# Patient Record
Sex: Male | Born: 1985 | Race: Black or African American | Hispanic: No | Marital: Married | Smoking: Current every day smoker
Health system: Southern US, Community
[De-identification: ages and names within clinical notes are randomized; demographics above are authoritative.]

## PROBLEM LIST (undated history)

## (undated) DIAGNOSIS — K219 Gastro-esophageal reflux disease without esophagitis: Secondary | ICD-10-CM

---
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
## 2006-05-23 ENCOUNTER — Emergency Department (HOSPITAL_COMMUNITY): Admission: EM | Admit: 2006-05-23 | Discharge: 2006-05-23 | Payer: Self-pay | Admitting: Family Medicine

## 2006-07-02 ENCOUNTER — Emergency Department (HOSPITAL_COMMUNITY): Admission: EM | Admit: 2006-07-02 | Discharge: 2006-07-02 | Payer: Self-pay | Admitting: Emergency Medicine

## 2006-09-23 ENCOUNTER — Emergency Department (HOSPITAL_COMMUNITY): Admission: EM | Admit: 2006-09-23 | Discharge: 2006-09-23 | Payer: Self-pay | Admitting: Emergency Medicine

## 2006-10-29 ENCOUNTER — Emergency Department (HOSPITAL_COMMUNITY): Admission: EM | Admit: 2006-10-29 | Discharge: 2006-10-29 | Payer: Self-pay | Admitting: Emergency Medicine

## 2008-10-04 ENCOUNTER — Emergency Department (HOSPITAL_COMMUNITY): Admission: EM | Admit: 2008-10-04 | Discharge: 2008-10-04 | Payer: Self-pay | Admitting: Family Medicine

## 2010-05-06 ENCOUNTER — Emergency Department (HOSPITAL_COMMUNITY)
Admission: EM | Admit: 2010-05-06 | Discharge: 2010-05-06 | Disposition: A | Discharge status: Home / Self Care | Payer: No Typology Code available for payment source | Attending: Emergency Medicine | Admitting: Emergency Medicine

## 2010-05-06 ENCOUNTER — Emergency Department (HOSPITAL_COMMUNITY): Payer: No Typology Code available for payment source

## 2010-05-06 DIAGNOSIS — S139XXA Sprain of joints and ligaments of unspecified parts of neck, initial encounter: Secondary | ICD-10-CM | POA: Insufficient documentation

## 2010-05-06 DIAGNOSIS — M542 Cervicalgia: Secondary | ICD-10-CM | POA: Insufficient documentation

## 2010-05-06 DIAGNOSIS — M2569 Stiffness of other specified joint, not elsewhere classified: Secondary | ICD-10-CM | POA: Insufficient documentation

## 2010-05-06 DIAGNOSIS — Y929 Unspecified place or not applicable: Secondary | ICD-10-CM | POA: Insufficient documentation

## 2012-04-01 ENCOUNTER — Emergency Department (HOSPITAL_COMMUNITY): Payer: Self-pay

## 2012-04-01 ENCOUNTER — Encounter (HOSPITAL_COMMUNITY): Payer: Self-pay | Admitting: Emergency Medicine

## 2012-04-01 ENCOUNTER — Emergency Department (HOSPITAL_COMMUNITY)
Admission: EM | Admit: 2012-04-01 | Discharge: 2012-04-01 | Disposition: A | Discharge status: Home / Self Care | Payer: Self-pay | Attending: Emergency Medicine | Admitting: Emergency Medicine

## 2012-04-01 DIAGNOSIS — F17200 Nicotine dependence, unspecified, uncomplicated: Secondary | ICD-10-CM | POA: Insufficient documentation

## 2012-04-01 DIAGNOSIS — Z8739 Personal history of other diseases of the musculoskeletal system and connective tissue: Secondary | ICD-10-CM | POA: Insufficient documentation

## 2012-04-01 DIAGNOSIS — M25579 Pain in unspecified ankle and joints of unspecified foot: Secondary | ICD-10-CM | POA: Insufficient documentation

## 2012-04-01 DIAGNOSIS — M25561 Pain in right knee: Secondary | ICD-10-CM

## 2012-04-01 MED ORDER — IBUPROFEN 800 MG PO TABS
800.0000 mg | ORAL_TABLET | Freq: Once | ORAL | Status: AC
  Administered 2012-04-01: 800 mg via ORAL
  Filled 2012-04-01: qty 1

## 2012-04-01 MED ORDER — HYDROCODONE-ACETAMINOPHEN 5-325 MG PO TABS
1.0000 | ORAL_TABLET | Freq: Once | ORAL | Status: AC
  Administered 2012-04-01: 1 via ORAL
  Filled 2012-04-01: qty 1

## 2012-04-01 MED ORDER — HYDROCODONE-ACETAMINOPHEN 5-325 MG PO TABS: 1.0000 | ORAL_TABLET | Freq: Three times a day (TID) | ORAL | Status: DC | PRN

## 2012-04-01 NOTE — ED Notes (Signed)
Pt complains of right knee pain

## 2012-04-01 NOTE — ED Provider Notes (Signed)
Medical screening examination/treatment/procedure(s) were performed by non-physician practitioner and as supervising physician I was immediately available for consultation/collaboration.   Rolan Bucco, MD 04/01/12 2240

## 2012-04-01 NOTE — ED Notes (Signed)
Pt states he would purchase a knee brace

## 2012-04-01 NOTE — ED Notes (Signed)
Patient transported to X-ray 

## 2012-04-01 NOTE — ED Provider Notes (Signed)
History    This chart was scribed for non-physician practitioner, Marlon Pel PA-C working with Rolan Bucco, MD by Smitty Pluck, ED scribe. This patient was seen in room WTR8/WTR8 and the patient's care was started at 5:34 PM.   CSN: [id number]  Arrival date & time 04/01/12  1620      Chief Complaint  Patient presents with  . Knee Pain    The history is provided by the patient. No language interpreter was used.   Gary Newton is a 27 y.o. male who presents to the Emergency Department complaining of constant, moderate right knee pain onset 2 days ago. Pt reports that he has a partial ACL tear that was diagnosed 1 year ago. He states that he stands on his feet at work for long periods of time. He has taken tylenol without relief. He states the pain is aggravated by standing and walking. Pt denies injury to knee, fall, fever, chills, nausea, vomiting, diarrhea, weakness, cough, SOB and any other pain.   Pt does not have PCP.    History reviewed. No pertinent past medical history.  History reviewed. No pertinent past surgical history.  No family history on file.  History  Substance Use Topics  . Smoking status: Current Every Day Smoker -- 0.50 packs/day    Types: Cigarettes  . Smokeless tobacco: Not on file  . Alcohol Use: Yes      Review of Systems  Constitutional: Negative for chills.  Respiratory: Negative for shortness of breath.   Gastrointestinal: Negative for nausea and vomiting.  Musculoskeletal: Positive for arthralgias.  Neurological: Negative for weakness.    Allergies  Review of patient's allergies indicates no known allergies.  Home Medications   Current Outpatient Rx  Name  Route  Sig  Dispense  Refill  . HYDROcodone-acetaminophen (NORCO/VICODIN) 5-325 MG per tablet   Oral   Take 1 tablet by mouth every 8 (eight) hours as needed for pain.   8 tablet   0     BP 108/70  Pulse 63  Temp(Src) 97.3 F (36.3 C) (Oral)  SpO2 100%  Physical  Exam  Nursing note and vitals reviewed. Constitutional: He is oriented to person, place, and time. He appears well-developed and well-nourished. No distress.  HENT:  Head: Normocephalic and atraumatic.  Eyes: EOM are normal.  Neck: Neck supple. No tracheal deviation present.  Cardiovascular: Normal rate.   Pulmonary/Chest: Effort normal. No respiratory distress.  Musculoskeletal: Normal range of motion.       Right knee: He exhibits normal range of motion, no swelling, no effusion, no ecchymosis, no deformity, no laceration, no erythema, normal alignment, no LCL laxity and normal patellar mobility. Tenderness found. Medial joint line tenderness noted. No lateral joint line, no MCL, no LCL and no patellar tendon tenderness noted.  Neurological: He is alert and oriented to person, place, and time.  Skin: Skin is warm and dry.  Psychiatric: He has a normal mood and affect. His behavior is normal.    ED Course  Procedures (including critical care time) DIAGNOSTIC STUDIES: Oxygen Saturation is 100% on room air, normal by my interpretation.    COORDINATION OF CARE: 5:39 PM Discussed ED treatment with pt and pt agrees to pain medication. Suggested pt wear knee sleeve and pt use ibuprofen. Referred to orthopedist Dr.Arthur Montez Morita- per patient request, his mom goes to Dr. Montez Morita  .     Labs Reviewed - No data to display Dg Knee Complete 4 Views Right  04/01/2012  *RADIOLOGY  REPORT*  Clinical Data: Right knee pain, swelling, increased medial pain, known right ACL tear  RIGHT KNEE - COMPLETE 4+ VIEW  Comparison: 10/29/2006  Findings: Osseous mineralization normal. Joint spaces preserved. No acute fracture, dislocation or bone destruction. Spurring at inferior patella. No knee joint effusion.  IMPRESSION: No acute osseous abnormalities.   Original Report Authenticated By: Ulyses Southward, M.D.      1. Joint pain, knee, left       MDM  Pt has been advised of the symptoms that warrant their return  to the ED. Patient has voiced understanding and has agreed to follow-up with the PCP or specialist.  I personally performed the services described in this documentation, which was scribed in my presence. The recorded information has been reviewed and is accurate.    Dorthula Matas, PA-C 04/01/12 1802

## 2014-05-19 ENCOUNTER — Encounter (HOSPITAL_COMMUNITY): Payer: Self-pay | Admitting: Emergency Medicine

## 2014-05-19 ENCOUNTER — Emergency Department (HOSPITAL_COMMUNITY)
Admission: EM | Admit: 2014-05-19 | Discharge: 2014-05-19 | Disposition: A | Discharge status: Home / Self Care | Payer: Self-pay | Attending: Emergency Medicine | Admitting: Emergency Medicine

## 2014-05-19 DIAGNOSIS — Z720 Tobacco use: Secondary | ICD-10-CM | POA: Insufficient documentation

## 2014-05-19 DIAGNOSIS — K088 Other specified disorders of teeth and supporting structures: Secondary | ICD-10-CM | POA: Insufficient documentation

## 2014-05-19 DIAGNOSIS — K029 Dental caries, unspecified: Secondary | ICD-10-CM | POA: Insufficient documentation

## 2014-05-19 MED ORDER — AMOXICILLIN 500 MG PO CAPS
1000.0000 mg | ORAL_CAPSULE | Freq: Once | ORAL | Status: AC
  Administered 2014-05-19: 1000 mg via ORAL
  Filled 2014-05-19: qty 2

## 2014-05-19 MED ORDER — AMOXICILLIN 500 MG PO CAPS: 1000.0000 mg | ORAL_CAPSULE | Freq: Two times a day (BID) | ORAL | Status: DC

## 2014-05-19 MED ORDER — OXYCODONE-ACETAMINOPHEN 5-325 MG PO TABS: 1.0000 | ORAL_TABLET | ORAL | Status: DC | PRN

## 2014-05-19 NOTE — ED Provider Notes (Signed)
CSN: [id number]     Arrival date & time 05/19/14  0414 History   First MD Initiated Contact with Patient 05/19/14 0515     Chief Complaint  Patient presents with  . Dental Pain     (Consider location/radiation/quality/duration/timing/severity/associated sxs/prior Treatment) Patient is a 29 y.o. male presenting with tooth pain. The history is provided by the patient.  Dental Pain He complains of toothache and a left lower molar for the last 4 months. Pain has been intermittent but has been constant over the last 3 days. He is unable to get any sleep because it appeared he rates pain at 10/10. Pain is better when he is active but worse if he lays still. He has taken ibuprofen without relief. He took an antibiotic from a friend who also had a toothache and this did not seem to give any relief.  History reviewed. No pertinent past medical history. History reviewed. No pertinent past surgical history. History reviewed. No pertinent family history. History  Substance Use Topics  . Smoking status: Current Every Day Smoker -- 0.50 packs/day    Types: Cigarettes  . Smokeless tobacco: Not on file  . Alcohol Use: Yes    Review of Systems  All other systems reviewed and are negative.     Allergies  Review of patient's allergies indicates no known allergies.  Home Medications   Prior to Admission medications   Medication Sig Start Date End Date Taking? Authorizing Provider  HYDROcodone-acetaminophen (NORCO/VICODIN) 5-325 MG per tablet Take 1 tablet by mouth every 8 (eight) hours as needed for pain. 04/01/12   Tiffany Neva SeatGreene, PA-C   BP 143/83 mmHg  Pulse 64  Temp(Src) 98.3 F (36.8 C) (Oral)  Resp 16  Ht 6' (1.829 m)  Wt 255 lb (115.667 kg)  BMI 34.58 kg/m2  SpO2 99% Physical Exam  Nursing note and vitals reviewed.  29 year old male, resting comfortably and in no acute distress. Vital signs are significant for borderline hypertension. Oxygen saturation is 99%, which is  normal. Head is normocephalic and atraumatic. PERRLA, EOMI. Oropharynx is clear. Dental caries are noted into the #17 with tenderness to percussion over the carious area. There is no gingival erythema, swelling, pallor. There is adequate space in his mouth for all of his third molars. Neck is nontender and supple without adenopathy or JVD. Back is nontender and there is no CVA tenderness. Lungs are clear without rales, wheezes, or rhonchi. Chest is nontender. Heart has regular rate and rhythm without murmur. Abdomen is soft, flat, nontender without masses or hepatosplenomegaly and peristalsis is normoactive. Extremities have no cyanosis or edema, full range of motion is present. Skin is warm and dry without rash. Neurologic: Mental status is normal, cranial nerves are intact, there are no motor or sensory deficits.  ED Course  Procedures (including critical care time)  MDM   Final diagnoses:  Pain due to dental caries    dental pain from caries in tooth #17. He is referred to the on-call dentist and is given prescriptions for amoxicillin and oxycodone-acetaminophen. Prior to prescription given, I did review his record on the [place] controlled substance reporting website and found no narcotic prescriptions in the last 6 months.    Dione Boozeavid Shonta Bourque, MD 05/19/14 [id number]

## 2014-05-19 NOTE — ED Notes (Signed)
Dental pain increased for the last day, with stiff jaw.  Patient states that he has had this for 4 months off and on.  Patient has not seen a dentist.

## 2014-05-19 NOTE — Discharge Instructions (Signed)
Dental Caries °Dental caries (also called tooth decay) is the most common oral disease. It can occur at any age but is more common in children and young adults.  °HOW DENTAL CARIES DEVELOPS  °The process of decay begins when bacteria and foods (particularly sugars and starches) combine in your mouth to produce plaque. Plaque is a substance that sticks to the hard, outer surface of a tooth (enamel). The bacteria in plaque produce acids that attack enamel. These acids may also attack the root surface of a tooth (cementum) if it is exposed. Repeated attacks dissolve these surfaces and create holes in the tooth (cavities). If left untreated, the acids destroy the other layers of the tooth.  °RISK FACTORS °· Frequent sipping of sugary beverages.   °· Frequent snacking on sugary and starchy foods, especially those that easily get stuck in the teeth.   °· Poor oral hygiene.   °· Dry mouth.   °· Substance abuse such as methamphetamine abuse.   °· Broken or poor-fitting dental restorations.   °· Eating disorders.   °· Gastroesophageal reflux disease (GERD).   °· Certain radiation treatments to the head and neck. °SYMPTOMS °In the early stages of dental caries, symptoms are seldom present. Sometimes white, chalky areas may be seen on the enamel or other tooth layers. In later stages, symptoms may include: °· Pits and holes on the enamel. °· Toothache after sweet, hot, or cold foods or drinks are consumed. °· Pain around the tooth. °· Swelling around the tooth. °DIAGNOSIS  °Most of the time, dental caries is detected during a regular dental checkup. A diagnosis is made after a thorough medical and dental history is taken and the surfaces of your teeth are checked for signs of dental caries. Sometimes special instruments, such as lasers, are used to check for dental caries. Dental X-ray exams may be taken so that areas not visible to the eye (such as between the contact areas of the teeth) can be checked for cavities.    °TREATMENT  °If dental caries is in its early stages, it may be reversed with a fluoride treatment or an application of a remineralizing agent at the dental office. Thorough brushing and flossing at home is needed to aid these treatments. If it is in its later stages, treatment depends on the location and extent of tooth destruction:  °· If a small area of the tooth has been destroyed, the destroyed area will be removed and cavities will be filled with a material such as gold, silver amalgam, or composite resin.   °· If a large area of the tooth has been destroyed, the destroyed area will be removed and a cap (crown) will be fitted over the remaining tooth structure.   °· If the center part of the tooth (pulp) is affected, a procedure called a root canal will be needed before a filling or crown can be placed.   °· If most of the tooth has been destroyed, the tooth may need to be pulled (extracted). °HOME CARE INSTRUCTIONS °You can prevent, stop, or reverse dental caries at home by practicing good oral hygiene. Good oral hygiene includes: °· Thoroughly cleaning your teeth at least twice a day with a toothbrush and dental floss.   °· Using a fluoride toothpaste. A fluoride mouth rinse may also be used if recommended by your dentist or health care provider.   °· Restricting the amount of sugary and starchy foods and sugary liquids you consume.   °· Avoiding frequent snacking on these foods and sipping of these liquids.   °· Keeping regular visits with   a dentist for checkups and cleanings. °PREVENTION  °· Practice good oral hygiene. °· Consider a dental sealant. A dental sealant is a coating material that is applied by your dentist to the pits and grooves of teeth. The sealant prevents food from being trapped in them. It may protect the teeth for several years. °· Ask about fluoride supplements if you live in a community without fluorinated water or with water that has a low fluoride content. Use fluoride supplements  as directed by your dentist or health care provider. °· Allow fluoride varnish applications to teeth if directed by your dentist or health care provider. °Document Released: 09/12/2001 Document Revised: 05/07/2013 Document Reviewed: 12/24/2011 °ExitCare® Patient Information ©2015 [employer]. This information is not intended to replace advice given to you by your health care provider. Make sure you discuss any questions you have with your health care provider. ° °Amoxicillin capsules or tablets °What is this medicine? °AMOXICILLIN (a mox i SIL in) is a penicillin antibiotic. It is used to treat certain kinds of bacterial infections. It will not work for colds, flu, or other viral infections. °This medicine may be used for other purposes; ask your health care provider or pharmacist if you have questions. °COMMON BRAND NAME(S): Amoxil, Moxilin, Sumox, Trimox °What should I tell my health care provider before I take this medicine? °They need to know if you have any of these conditions: °-asthma °-kidney disease °-an unusual or allergic reaction to amoxicillin, other penicillins, cephalosporin antibiotics, other medicines, foods, dyes, or preservatives °-pregnant or trying to get pregnant °-breast-feeding °How should I use this medicine? °Take this medicine by mouth with a glass of water. Follow the directions on your prescription label. You may take this medicine with food or on an empty stomach. Take your medicine at regular intervals. Do not take your medicine more often than directed. Take all of your medicine as directed even if you think your are better. Do not skip doses or stop your medicine early. °Talk to your pediatrician regarding the use of this medicine in children. While this drug may be prescribed for selected conditions, precautions do apply. °Overdosage: If you think you have taken too much of this medicine contact a poison control center or emergency room at once. °NOTE: This medicine is only for  you. Do not share this medicine with others. °What if I miss a dose? °If you miss a dose, take it as soon as you can. If it is almost time for your next dose, take only that dose. Do not take double or extra doses. °What may interact with this medicine? °-amiloride °-birth control pills °-chloramphenicol °-macrolides °-probenecid °-sulfonamides °-tetracyclines °This list may not describe all possible interactions. Give your health care provider a list of all the medicines, herbs, non-prescription drugs, or dietary supplements you use. Also tell them if you smoke, drink alcohol, or use illegal drugs. Some items may interact with your medicine. °What should I watch for while using this medicine? °Tell your doctor or health care professional if your symptoms do not improve in 2 or 3 days. Take all of the doses of your medicine as directed. Do not skip doses or stop your medicine early. °If you are diabetic, you may get a false positive result for sugar in your urine with certain brands of urine tests. Check with your doctor. °Do not treat diarrhea with over-the-counter products. Contact your doctor if you have diarrhea that lasts more than 2 days or if the diarrhea is severe and   watery. °What side effects may I notice from receiving this medicine? °Side effects that you should report to your doctor or health care professional as soon as possible: °-allergic reactions like skin rash, itching or hives, swelling of the face, lips, or tongue °-breathing problems °-dark urine °-redness, blistering, peeling or loosening of the skin, including inside the mouth °-seizures °-severe or watery diarrhea °-trouble passing urine or change in the amount of urine °-unusual bleeding or bruising °-unusually weak or tired °-yellowing of the eyes or skin °Side effects that usually do not require medical attention (report to your doctor or health care professional if they continue or are bothersome): °-dizziness °-headache °-stomach  upset °-trouble sleeping °This list may not describe all possible side effects. Call your doctor for medical advice about side effects. You may report side effects to FDA at 1-800-FDA-1088. °Where should I keep my medicine? °Keep out of the reach of children. °Store between 68 and 77 degrees F (20 and 25 degrees C). Keep bottle closed tightly. Throw away any unused medicine after the expiration date. °NOTE: This sheet is a summary. It may not cover all possible information. If you have questions about this medicine, talk to your doctor, pharmacist, or health care provider. °© 2015, Elsevier/Gold Standard. (2007-03-14 14:10:59) ° °Acetaminophen; Oxycodone tablets °What is this medicine? °ACETAMINOPHEN; OXYCODONE (a set a MEE noe fen; ox i KOE done) is a pain reliever. It is used to treat mild to moderate pain. °This medicine may be used for other purposes; ask your health care provider or pharmacist if you have questions. °COMMON BRAND NAME(S): Endocet, Magnacet, Narvox, Percocet, Perloxx, Primalev, Primlev, Roxicet, Xolox °What should I tell my health care provider before I take this medicine? °They need to know if you have any of these conditions: °-brain tumor °-Crohn's disease, inflammatory bowel disease, or ulcerative colitis °-drug abuse or addiction °-head injury °-heart or circulation problems °-if you often drink alcohol °-kidney disease or problems going to the bathroom °-liver disease °-lung disease, asthma, or breathing problems °-an unusual or allergic reaction to acetaminophen, oxycodone, other opioid analgesics, other medicines, foods, dyes, or preservatives °-pregnant or trying to get pregnant °-breast-feeding °How should I use this medicine? °Take this medicine by mouth with a full glass of water. Follow the directions on the prescription label. Take your medicine at regular intervals. Do not take your medicine more often than directed. °Talk to your pediatrician regarding the use of this medicine in  children. Special care may be needed. °Patients over 65 years old may have a stronger reaction and need a smaller dose. °Overdosage: If you think you have taken too much of this medicine contact a poison control center or emergency room at once. °NOTE: This medicine is only for you. Do not share this medicine with others. °What if I miss a dose? °If you miss a dose, take it as soon as you can. If it is almost time for your next dose, take only that dose. Do not take double or extra doses. °What may interact with this medicine? °-alcohol °-antihistamines °-barbiturates like amobarbital, butalbital, butabarbital, methohexital, pentobarbital, phenobarbital, thiopental, and secobarbital °-benztropine °-drugs for bladder problems like solifenacin, trospium, oxybutynin, tolterodine, hyoscyamine, and methscopolamine °-drugs for breathing problems like ipratropium and tiotropium °-drugs for certain stomach or intestine problems like propantheline, homatropine methylbromide, glycopyrrolate, atropine, belladonna, and dicyclomine °-general anesthetics like etomidate, ketamine, nitrous oxide, propofol, desflurane, enflurane, halothane, isoflurane, and sevoflurane °-medicines for depression, anxiety, or psychotic disturbances °-medicines for sleep °-muscle relaxants °-naltrexone °-  narcotic medicines (opiates) for pain °-phenothiazines like perphenazine, thioridazine, chlorpromazine, mesoridazine, fluphenazine, prochlorperazine, promazine, and trifluoperazine °-scopolamine °-tramadol °-trihexyphenidyl °This list may not describe all possible interactions. Give your health care provider a list of all the medicines, herbs, non-prescription drugs, or dietary supplements you use. Also tell them if you smoke, drink alcohol, or use illegal drugs. Some items may interact with your medicine. °What should I watch for while using this medicine? °Tell your doctor or health care professional if your pain does not go away, if it gets worse,  or if you have new or a different type of pain. You may develop tolerance to the medicine. Tolerance means that you will need a higher dose of the medication for pain relief. Tolerance is normal and is expected if you take this medicine for a long time. °Do not suddenly stop taking your medicine because you may develop a severe reaction. Your body becomes used to the medicine. This does NOT mean you are addicted. Addiction is a behavior related to getting and using a drug for a non-medical reason. If you have pain, you have a medical reason to take pain medicine. Your doctor will tell you how much medicine to take. If your doctor wants you to stop the medicine, the dose will be slowly lowered over time to avoid any side effects. °You may get drowsy or dizzy. Do not drive, use machinery, or do anything that needs mental alertness until you know how this medicine affects you. Do not stand or sit up quickly, especially if you are an older patient. This reduces the risk of dizzy or fainting spells. Alcohol may interfere with the effect of this medicine. Avoid alcoholic drinks. °There are different types of narcotic medicines (opiates) for pain. If you take more than one type at the same time, you may have more side effects. Give your health care provider a list of all medicines you use. Your doctor will tell you how much medicine to take. Do not take more medicine than directed. Call emergency for help if you have problems breathing. °The medicine will cause constipation. Try to have a bowel movement at least every 2 to 3 days. If you do not have a bowel movement for 3 days, call your doctor or health care professional. °Do not take Tylenol (acetaminophen) or medicines that have acetaminophen with this medicine. Too much acetaminophen can be very dangerous. Many nonprescription medicines contain acetaminophen. Always read the labels carefully to avoid taking more acetaminophen. °What side effects may I notice from  receiving this medicine? °Side effects that you should report to your doctor or health care professional as soon as possible: °-allergic reactions like skin rash, itching or hives, swelling of the face, lips, or tongue °-breathing difficulties, wheezing °-confusion °-light headedness or fainting spells °-severe stomach pain °-unusually weak or tired °-yellowing of the skin or the whites of the eyes °Side effects that usually do not require medical attention (report to your doctor or health care professional if they continue or are bothersome): °-dizziness °-drowsiness °-nausea °-vomiting °This list may not describe all possible side effects. Call your doctor for medical advice about side effects. You may report side effects to FDA at 1-800-FDA-1088. °Where should I keep my medicine? °Keep out of the reach of children. This medicine can be abused. Keep your medicine in a safe place to protect it from theft. Do not share this medicine with anyone. Selling or giving away this medicine is dangerous and against the law. °Store at   room temperature between 20 and 25 degrees C (68 and 77 degrees F). Keep container tightly closed. Protect from light. °This medicine may cause accidental overdose and death if it is taken by other adults, children, or pets. Flush any unused medicine down the toilet to reduce the chance of harm. Do not use the medicine after the expiration date. °NOTE: This sheet is a summary. It may not cover all possible information. If you have questions about this medicine, talk to your doctor, pharmacist, or health care provider. °© 2015, Elsevier/Gold Standard. (2012-08-14 13:17:35) ° ° °Emergency Department Resource Guide ° °Dental Care: °Organization         Address  Phone  Notes  °[facility] [address], [place] [phone] Accepts children up to age 21 who are enrolled in Medicaid or [place] Health Choice; pregnant women with a  Medicaid card; and children who have applied for Medicaid or [place] Health Choice, but were declined, whose parents can pay a reduced fee at time of service.  °[facility] [place]  [address], [place] [phone] Accepts children up to age 21 who are enrolled in Medicaid or [place] Health Choice; pregnant women with a Medicaid card; and children who have applied for Medicaid or [place] Health Choice, but were declined, whose parents can pay a reduced fee at time of service.  °[facility] ° [address], [place] [phone] Patients are seen by appointment only. Walk-ins are not accepted. [facility] will see patients 18 years of age and older. °Monday - Tuesday (8am-5pm) °Most Wednesdays (8:30-5pm) °$30 per visit, cash only  °[facility] ° [address], [place] [phone] Patients are seen by appointment only. Walk-ins are not accepted. [facility] will see patients 18 years of age and older. °One Wednesday Evening (Monthly: Volunteer Based).  $30 per visit, cash only  °[facility]  [phone] for adults; Children under age 4, call Graduate Pediatric Dentistry at [phone]. Children aged 4-14, please call [phone] to request a pediatric application. ° Dental services are provided in all areas of dental care including fillings, crowns and bridges, complete and partial dentures, implants, gum treatment, root canals, and extractions. Preventive care is also provided. Treatment is provided to both adults and children. °Patients are selected via a lottery and there is often a waiting list. °  °[facility] [address], °[place] ° [phone] [email or web address] °  °[facility] [address], [place], [place] [phone], Ext. [phone] Second and Fourth Thursday of each month, opens at 6:30 AM; Clinic ends at 9 AM.  Patients are seen on a  first-come first-served basis, and a limited number are seen during each clinic.  ° °[facility] ° [address], [place], [place] [phone]   Eligibility Requirements °You must have lived in [place], [place], or [place] counties for at least the last three months. °  You cannot be eligible for state or federal sponsored healthcare insurance, including Veterans Administration, Medicaid, or Medicare. °  You generally cannot be eligible for healthcare insurance through your employer.  °  How to apply: °Eligibility screenings are held every Tuesday and Wednesday afternoon from 1:00 pm until 4:00 pm. You do not need an appointment for the interview!  °[facility] [address], [place], [place]   [phone]   °[facility]  [phone]   °[facility]  [phone]   °[facility]  [phone]   ° °

## 2014-05-19 NOTE — ED Notes (Signed)
MD at bedside. 

## 2014-07-04 ENCOUNTER — Encounter (HOSPITAL_COMMUNITY): Payer: Self-pay | Admitting: *Deleted

## 2014-07-04 ENCOUNTER — Emergency Department (INDEPENDENT_AMBULATORY_CARE_PROVIDER_SITE_OTHER)
Admission: EM | Admit: 2014-07-04 | Discharge: 2014-07-04 | Disposition: A | Discharge status: Home / Self Care | Payer: Self-pay | Source: Home / Self Care | Attending: Family Medicine | Admitting: Family Medicine

## 2014-07-04 DIAGNOSIS — L03012 Cellulitis of left finger: Secondary | ICD-10-CM

## 2014-07-04 DIAGNOSIS — Z23 Encounter for immunization: Secondary | ICD-10-CM

## 2014-07-04 DIAGNOSIS — S61219A Laceration without foreign body of unspecified finger without damage to nail, initial encounter: Secondary | ICD-10-CM

## 2014-07-04 MED ORDER — HYDROCODONE-ACETAMINOPHEN 5-325 MG PO TABS: 1.0000 | ORAL_TABLET | ORAL | Status: DC | PRN

## 2014-07-04 MED ORDER — TETANUS-DIPHTH-ACELL PERTUSSIS 5-2.5-18.5 LF-MCG/0.5 IM SUSP
0.5000 mL | Freq: Once | INTRAMUSCULAR | Status: AC
  Administered 2014-07-04: 0.5 mL via INTRAMUSCULAR

## 2014-07-04 MED ORDER — CEPHALEXIN 500 MG PO CAPS
500.0000 mg | ORAL_CAPSULE | Freq: Four times a day (QID) | ORAL | Status: DC
Start: 2014-07-04 — End: 2015-11-14

## 2014-07-04 MED ORDER — TETANUS-DIPHTH-ACELL PERTUSSIS 5-2.5-18.5 LF-MCG/0.5 IM SUSP
INTRAMUSCULAR | Status: AC
  Filled 2014-07-04: qty 0.5

## 2014-07-04 NOTE — ED Notes (Signed)
Pt  Reports some  Pain  l finger       X  2     Days        Pt  Reports    The     Pt  Sustained a  Superficial lacertion to  The  Affected  Finger

## 2014-07-04 NOTE — Discharge Instructions (Signed)
Cellulitis Cellulitis is an infection of the skin and the tissue under the skin. The infected area is usually red and tender. This happens most often in the arms and lower legs. HOME CARE   Take your antibiotic medicine as told. Finish the medicine even if you start to feel better.  Keep the infected arm or leg raised (elevated).  Put a warm cloth on the area up to 4 times per day.  Only take medicines as told by your doctor.  Keep all doctor visits as told. GET HELP IF:  You see red streaks on the skin coming from the infected area.  Your red area gets bigger or turns a dark color.  Your bone or joint under the infected area is painful after the skin heals.  Your infection comes back in the same area or different area.  You have a puffy (swollen) bump in the infected area.  You have new symptoms.  You have a fever. GET HELP RIGHT AWAY IF:   You feel very sleepy.  You throw up (vomit) or have watery poop (diarrhea).  You feel sick and have muscle aches and pains. MAKE SURE YOU:   Understand these instructions.  Will watch your condition.  Will get help right away if you are not doing well or get worse. Document Released: 06/09/2007 Document Revised: 05/07/2013 Document Reviewed: 03/08/2011 [facility] Patient Information 2015 [place], [place]. This information is not intended to replace advice given to you by your health care provider. Make sure you discuss any questions you have with your health care provider.  Laceration Care, Adult A laceration is a cut that goes through all layers of the skin. The cut goes into the tissue beneath the skin. HOME CARE For stitches (sutures) or staples:  Keep the cut clean and dry.  If you have a bandage (dressing), change it at least once a day. Change the bandage if it gets wet or dirty, or as told by your doctor.  Wash the cut with soap and water 2 times a day. Rinse the cut with water. Pat it dry with a clean towel.  Put a  thin layer of medicated cream on the cut as told by your doctor.  You may shower after the first 24 hours. Do not soak the cut in water until the stitches are removed.  Only take medicines as told by your doctor.  Have your stitches or staples removed as told by your doctor. For skin adhesive strips:  Keep the cut clean and dry.  Do not get the strips wet. You may take a bath, but be careful to keep the cut dry.  If the cut gets wet, pat it dry with a clean towel.  The strips will fall off on their own. Do not remove the strips that are still stuck to the cut. For wound glue:  You may shower or take baths. Do not soak or scrub the cut. Do not swim. Avoid heavy sweating until the glue falls off on its own. After a shower or bath, pat the cut dry with a clean towel.  Do not put medicine on your cut until the glue falls off.  If you have a bandage, do not put tape over the glue.  Avoid lots of sunlight or tanning lamps until the glue falls off. Put sunscreen on the cut for the first year to reduce your scar.  The glue will fall off on its own. Do not pick at the glue. You may need  a tetanus shot if:  You cannot remember when you had your last tetanus shot.  You have never had a tetanus shot. If you need a tetanus shot and you choose not to have one, you may get tetanus. Sickness from tetanus can be serious. GET HELP RIGHT AWAY IF:   Your pain does not get better with medicine.  Your arm, hand, leg, or foot loses feeling (numbness) or changes color.  Your cut is bleeding.  Your joint feels weak, or you cannot use your joint.  You have painful lumps on your body.  Your cut is red, puffy (swollen), or painful.  You have a red line on the skin near the cut.  You have yellowish-white fluid (pus) coming from the cut.  You have a fever.  You have a bad smell coming from the cut or bandage.  Your cut breaks open before or after stitches are removed.  You notice something  coming out of the cut, such as wood or glass.  You cannot move a finger or toe. MAKE SURE YOU:   Understand these instructions.  Will watch your condition.  Will get help right away if you are not doing well or get worse. Document Released: 06/09/2007 Document Revised: 03/15/2011 Document Reviewed: 06/16/2010 [facility] Patient Information 2015 [place], [place]. This information is not intended to replace advice given to you by your health care provider. Make sure you discuss any questions you have with your health care provider.

## 2014-07-04 NOTE — ED Provider Notes (Signed)
CSN: [id number]     Arrival date & time 07/04/14  1331 History   First MD Initiated Contact with Patient 07/04/14 1429     Chief Complaint  Patient presents with  . Hand Pain   (Consider location/radiation/quality/duration/timing/severity/associated sxs/prior Treatment) HPI Comments: 29 year old male complaining of pain and throbbing to the left middle finger distal phalanx for 2 days. He states that one of his jobs he accidentally received a cut with a sharp object at the flexor crease of the left DIP. At this point the superficial laceration has healed over. His flexion ability is decreased but still present. There is no current exudates or bleeding at the laceration. It is dry and healing by secondary intention. It is actually difficult to see.   History reviewed. No pertinent past medical history. History reviewed. No pertinent past surgical history. History reviewed. No pertinent family history. History  Substance Use Topics  . Smoking status: Current Every Day Smoker -- 0.50 packs/day    Types: Cigarettes  . Smokeless tobacco: Not on file  . Alcohol Use: Yes    Review of Systems  Constitutional: Negative.   Respiratory: Negative.   Gastrointestinal: Negative.   Genitourinary: Negative.   Musculoskeletal: Negative for myalgias and back pain.  Skin: Negative.   Neurological: Negative.     Allergies  Review of patient's allergies indicates no known allergies.  Home Medications   Prior to Admission medications   Medication Sig Start Date End Date Taking? Authorizing Provider  amoxicillin (AMOXIL) 500 MG capsule Take 2 capsules (1,000 mg total) by mouth 2 (two) times daily. 05/19/14   Dione Booze, MD  cephALEXin (KEFLEX) 500 MG capsule Take 1 capsule (500 mg total) by mouth 4 (four) times daily. 07/04/14   Hayden Rasmussen, NP  HYDROcodone-acetaminophen (NORCO/VICODIN) 5-325 MG per tablet Take 1 tablet by mouth every 4 (four) hours as needed. 07/04/14   Hayden Rasmussen, NP   oxyCODONE-acetaminophen (PERCOCET) 5-325 MG per tablet Take 1 tablet by mouth every 4 (four) hours as needed for moderate pain. 05/19/14   Dione Booze, MD   BP 118/63 mmHg  Pulse 69  Temp(Src) 98.2 F (36.8 C) (Oral)  Resp 16  SpO2 99% Physical Exam  Constitutional: He appears well-developed and well-nourished. No distress.  Cardiovascular: Normal rate.   Pulmonary/Chest: Effort normal. No respiratory distress.  Musculoskeletal:  Mild swelling and redness to the left middle finger distal phalanx. No evidence of paronychia or felon. Mildly tender at the DIP joint. No changes to the middle phalanx. No erythema or swelling. Extension  is completely intact. Flexion against resistance to approximately 20 is intact. No exudates.  Neurological: He is alert. He exhibits normal muscle tone.  Skin: Skin is warm.  Psychiatric: He has a normal mood and affect.  Nursing note and vitals reviewed.   ED Course  Procedures (including critical care time) Labs Review Labs Reviewed - No data to display  Imaging Review No results found.   MDM   1. Laceration of finger of left hand, initial encounter   2. Cellulitis of finger of left hand    At this time doubt tenosynovitis. We will treat distal phalanx infection with Keflex 500 mg Administer T gap 0.5 cc IM and Rx for norco 5 mg #12. Soak finger in warm water a few times a day Discussed in detail red flags to promptly seek medical attention. For any increasing in swelling, redness, pain to distal phalanx or involvement of the middle phalanx he should seek medical attention immediately.  Hayden Rasmussenavid Fowler Antos, NP 07/04/14 1501

## 2014-07-06 ENCOUNTER — Ambulatory Visit (HOSPITAL_COMMUNITY)
Admission: EM | Admit: 2014-07-06 | Discharge: 2014-07-06 | Disposition: A | Discharge status: Home / Self Care | Payer: Self-pay | Attending: Emergency Medicine | Admitting: Emergency Medicine

## 2014-07-06 ENCOUNTER — Encounter (HOSPITAL_COMMUNITY): Payer: Self-pay | Admitting: *Deleted

## 2014-07-06 ENCOUNTER — Encounter (HOSPITAL_COMMUNITY)
Admission: EM | Disposition: A | Discharge status: Home / Self Care | Payer: Self-pay | Source: Home / Self Care | Attending: Emergency Medicine

## 2014-07-06 ENCOUNTER — Emergency Department (HOSPITAL_COMMUNITY): Payer: Self-pay | Admitting: Certified Registered Nurse Anesthetist

## 2014-07-06 DIAGNOSIS — L089 Local infection of the skin and subcutaneous tissue, unspecified: Secondary | ICD-10-CM

## 2014-07-06 DIAGNOSIS — M659 Synovitis and tenosynovitis, unspecified: Secondary | ICD-10-CM

## 2014-07-06 DIAGNOSIS — L02519 Cutaneous abscess of unspecified hand: Secondary | ICD-10-CM | POA: Insufficient documentation

## 2014-07-06 DIAGNOSIS — F17210 Nicotine dependence, cigarettes, uncomplicated: Secondary | ICD-10-CM | POA: Insufficient documentation

## 2014-07-06 HISTORY — PX: I&D EXTREMITY: SHX5045

## 2014-07-06 LAB — CBC WITH DIFFERENTIAL/PLATELET
Basophils Absolute: 0 10*3/uL (ref 0.0–0.1)
Basophils Relative: 0 % (ref 0–1)
EOS ABS: 0.1 10*3/uL (ref 0.0–0.7)
Eosinophils Relative: 1 % (ref 0–5)
HEMATOCRIT: 45.5 % (ref 39.0–52.0)
HEMOGLOBIN: 15 g/dL (ref 13.0–17.0)
Lymphocytes Relative: 19 % (ref 12–46)
Lymphs Abs: 1.5 10*3/uL (ref 0.7–4.0)
MCH: 27.5 pg (ref 26.0–34.0)
MCHC: 33 g/dL (ref 30.0–36.0)
MCV: 83.5 fL (ref 78.0–100.0)
Monocytes Absolute: 0.9 10*3/uL (ref 0.1–1.0)
Monocytes Relative: 12 % (ref 3–12)
NEUTROS ABS: 5.2 10*3/uL (ref 1.7–7.7)
Neutrophils Relative %: 68 % (ref 43–77)
Platelets: 247 10*3/uL (ref 150–400)
RBC: 5.45 MIL/uL (ref 4.22–5.81)
RDW: 13.9 % (ref 11.5–15.5)
WBC: 7.6 10*3/uL (ref 4.0–10.5)

## 2014-07-06 LAB — BASIC METABOLIC PANEL
Anion gap: 7 (ref 5–15)
BUN: 11 mg/dL (ref 6–20)
CO2: 29 mmol/L (ref 22–32)
CREATININE: 1.29 mg/dL — AB (ref 0.61–1.24)
Calcium: 9.3 mg/dL (ref 8.9–10.3)
Chloride: 104 mmol/L (ref 101–111)
GFR calc non Af Amer: >60 mL/min (ref >60)
GLUCOSE: 67 mg/dL (ref 65–99)
Potassium: 4.2 mmol/L (ref 3.5–5.1)
Sodium: 140 mmol/L (ref 135–145)

## 2014-07-06 SURGERY — IRRIGATION AND DEBRIDEMENT EXTREMITY
Anesthesia: General | Laterality: Left

## 2014-07-06 MED ORDER — PROPOFOL 10 MG/ML IV BOLUS
INTRAVENOUS | Status: DC | PRN
  Administered 2014-07-06: 200 mg via INTRAVENOUS

## 2014-07-06 MED ORDER — SUCCINYLCHOLINE CHLORIDE 20 MG/ML IJ SOLN
INTRAMUSCULAR | Status: DC | PRN
  Administered 2014-07-06: 100 mg via INTRAVENOUS

## 2014-07-06 MED ORDER — OXYCODONE-ACETAMINOPHEN 5-325 MG PO TABS: 1.0000 | ORAL_TABLET | ORAL | Status: DC | PRN

## 2014-07-06 MED ORDER — KETOROLAC TROMETHAMINE 30 MG/ML IJ SOLN
30.0000 mg | Freq: Once | INTRAMUSCULAR | Status: AC | PRN
  Administered 2014-07-06: 30 mg via INTRAVENOUS

## 2014-07-06 MED ORDER — OXYCODONE HCL 5 MG PO TABS
ORAL_TABLET | ORAL | Status: DC
Start: 2014-07-06 — End: 2014-07-06
  Filled 2014-07-06: qty 1

## 2014-07-06 MED ORDER — MIDAZOLAM HCL 2 MG/2ML IJ SOLN
INTRAMUSCULAR | Status: AC
  Filled 2014-07-06: qty 2

## 2014-07-06 MED ORDER — FENTANYL CITRATE (PF) 250 MCG/5ML IJ SOLN
INTRAMUSCULAR | Status: AC
  Filled 2014-07-06: qty 5

## 2014-07-06 MED ORDER — HYDROMORPHONE HCL 1 MG/ML IJ SOLN: 0.2500 mg | INTRAMUSCULAR | Status: DC | PRN

## 2014-07-06 MED ORDER — PROMETHAZINE HCL 25 MG/ML IJ SOLN: 6.2500 mg | INTRAMUSCULAR | Status: DC | PRN

## 2014-07-06 MED ORDER — ONDANSETRON HCL 4 MG/2ML IJ SOLN
INTRAMUSCULAR | Status: DC | PRN
  Administered 2014-07-06: 4 mg via INTRAVENOUS

## 2014-07-06 MED ORDER — PROPOFOL 10 MG/ML IV BOLUS
INTRAVENOUS | Status: AC
  Filled 2014-07-06: qty 20

## 2014-07-06 MED ORDER — CEFAZOLIN SODIUM-DEXTROSE 2-3 GM-% IV SOLR
INTRAVENOUS | Status: AC
  Filled 2014-07-06: qty 50

## 2014-07-06 MED ORDER — LACTATED RINGERS IV SOLN
INTRAVENOUS | Status: DC | PRN
Start: 2014-07-06 — End: 2014-07-06
  Administered 2014-07-06: 15:00:00 via INTRAVENOUS

## 2014-07-06 MED ORDER — HYDROMORPHONE HCL 1 MG/ML IJ SOLN
INTRAMUSCULAR | Status: AC
  Filled 2014-07-06: qty 1

## 2014-07-06 MED ORDER — OXYCODONE HCL 5 MG PO TABS
5.0000 mg | ORAL_TABLET | Freq: Once | ORAL | Status: AC | PRN
  Administered 2014-07-06: 5 mg via ORAL

## 2014-07-06 MED ORDER — OXYCODONE HCL 5 MG/5ML PO SOLN: 5.0000 mg | Freq: Once | ORAL | Status: AC | PRN

## 2014-07-06 MED ORDER — MEPERIDINE HCL 25 MG/ML IJ SOLN: 6.2500 mg | INTRAMUSCULAR | Status: DC | PRN

## 2014-07-06 MED ORDER — FENTANYL CITRATE (PF) 100 MCG/2ML IJ SOLN
INTRAMUSCULAR | Status: DC | PRN
  Administered 2014-07-06: 50 ug via INTRAVENOUS
  Administered 2014-07-06 (×2): 100 ug via INTRAVENOUS

## 2014-07-06 MED ORDER — CEFAZOLIN SODIUM-DEXTROSE 2-3 GM-% IV SOLR
INTRAVENOUS | Status: DC | PRN
  Administered 2014-07-06: 2 g via INTRAVENOUS

## 2014-07-06 MED ORDER — MIDAZOLAM HCL 5 MG/5ML IJ SOLN
INTRAMUSCULAR | Status: DC | PRN
  Administered 2014-07-06: 2 mg via INTRAVENOUS

## 2014-07-06 MED ORDER — DOXYCYCLINE HYCLATE 100 MG PO TABS: 100.0000 mg | ORAL_TABLET | Freq: Two times a day (BID) | ORAL | Status: DC

## 2014-07-06 MED ORDER — MORPHINE SULFATE 4 MG/ML IJ SOLN
4.0000 mg | Freq: Once | INTRAMUSCULAR | Status: AC
  Administered 2014-07-06: 4 mg via INTRAMUSCULAR
  Filled 2014-07-06: qty 1

## 2014-07-06 MED ORDER — KETOROLAC TROMETHAMINE 30 MG/ML IJ SOLN
INTRAMUSCULAR | Status: AC
  Filled 2014-07-06: qty 1

## 2014-07-06 SURGICAL SUPPLY — 56 items
BANDAGE ELASTIC 3 VELCRO ST LF (GAUZE/BANDAGES/DRESSINGS) ×3 IMPLANT
BANDAGE ELASTIC 4 VELCRO ST LF (GAUZE/BANDAGES/DRESSINGS) ×3 IMPLANT
BANDAGE GAUZE 4  KLING STR (GAUZE/BANDAGES/DRESSINGS) ×3 IMPLANT
BNDG COHESIVE 1X5 TAN STRL LF (GAUZE/BANDAGES/DRESSINGS) ×3 IMPLANT
BNDG CONFORM 2 STRL LF (GAUZE/BANDAGES/DRESSINGS) IMPLANT
BNDG ESMARK 4X9 LF (GAUZE/BANDAGES/DRESSINGS) ×3 IMPLANT
BNDG GAUZE ELAST 4 BULKY (GAUZE/BANDAGES/DRESSINGS) ×3 IMPLANT
CORDS BIPOLAR (ELECTRODE) ×3 IMPLANT
COVER SURGICAL LIGHT HANDLE (MISCELLANEOUS) ×3 IMPLANT
CUFF TOURNIQUET SINGLE 18IN (TOURNIQUET CUFF) ×3 IMPLANT
CUFF TOURNIQUET SINGLE 24IN (TOURNIQUET CUFF) IMPLANT
DRAIN PENROSE 1/4X12 LTX STRL (WOUND CARE) IMPLANT
DRAPE SURG 17X23 STRL (DRAPES) ×3 IMPLANT
DRSG ADAPTIC 3X8 NADH LF (GAUZE/BANDAGES/DRESSINGS) ×3 IMPLANT
ELECT REM PT RETURN 9FT ADLT (ELECTROSURGICAL)
ELECTRODE REM PT RTRN 9FT ADLT (ELECTROSURGICAL) IMPLANT
GAUZE SPONGE 2X2 8PLY STRL LF (GAUZE/BANDAGES/DRESSINGS) ×1 IMPLANT
GAUZE SPONGE 4X4 12PLY STRL (GAUZE/BANDAGES/DRESSINGS) ×3 IMPLANT
GAUZE XEROFORM 1X8 LF (GAUZE/BANDAGES/DRESSINGS) ×3 IMPLANT
GAUZE XEROFORM 5X9 LF (GAUZE/BANDAGES/DRESSINGS) IMPLANT
GLOVE BIOGEL PI IND STRL 8.5 (GLOVE) ×1 IMPLANT
GLOVE BIOGEL PI INDICATOR 8.5 (GLOVE) ×2
GLOVE SURG ORTHO 8.0 STRL STRW (GLOVE) ×3 IMPLANT
GOWN STRL REUS W/ TWL LRG LVL3 (GOWN DISPOSABLE) ×3 IMPLANT
GOWN STRL REUS W/ TWL XL LVL3 (GOWN DISPOSABLE) ×1 IMPLANT
GOWN STRL REUS W/TWL LRG LVL3 (GOWN DISPOSABLE) ×6
GOWN STRL REUS W/TWL XL LVL3 (GOWN DISPOSABLE) ×2
HANDPIECE INTERPULSE COAX TIP (DISPOSABLE)
KIT BASIN OR (CUSTOM PROCEDURE TRAY) ×3 IMPLANT
KIT ROOM TURNOVER OR (KITS) ×3 IMPLANT
MANIFOLD NEPTUNE II (INSTRUMENTS) ×3 IMPLANT
NEEDLE HYPO 25GX1X1/2 BEV (NEEDLE) IMPLANT
NS IRRIG 1000ML POUR BTL (IV SOLUTION) ×3 IMPLANT
PACK ORTHO EXTREMITY (CUSTOM PROCEDURE TRAY) ×3 IMPLANT
PAD ARMBOARD 7.5X6 YLW CONV (MISCELLANEOUS) ×6 IMPLANT
PAD CAST 4YDX4 CTTN HI CHSV (CAST SUPPLIES) ×1 IMPLANT
PADDING CAST COTTON 4X4 STRL (CAST SUPPLIES) ×2
SET HNDPC FAN SPRY TIP SCT (DISPOSABLE) IMPLANT
SOAP 2 % CHG 4 OZ (WOUND CARE) ×3 IMPLANT
SPONGE GAUZE 2X2 STER 10/PKG (GAUZE/BANDAGES/DRESSINGS) ×2
SPONGE LAP 18X18 X RAY DECT (DISPOSABLE) ×3 IMPLANT
SPONGE LAP 4X18 X RAY DECT (DISPOSABLE) ×3 IMPLANT
SUCTION FRAZIER TIP 10 FR DISP (SUCTIONS) ×3 IMPLANT
SUT ETHILON 4 0 PS 2 18 (SUTURE) IMPLANT
SUT ETHILON 5 0 P 3 18 (SUTURE)
SUT NYLON ETHILON 5-0 P-3 1X18 (SUTURE) IMPLANT
SUT PROLENE 4 0 PS 2 18 (SUTURE) ×3 IMPLANT
SYR CONTROL 10ML LL (SYRINGE) IMPLANT
TOWEL OR 17X24 6PK STRL BLUE (TOWEL DISPOSABLE) ×3 IMPLANT
TOWEL OR 17X26 10 PK STRL BLUE (TOWEL DISPOSABLE) ×3 IMPLANT
TUBE ANAEROBIC SPECIMEN COL (MISCELLANEOUS) IMPLANT
TUBE CONNECTING 12'X1/4 (SUCTIONS) ×1
TUBE CONNECTING 12X1/4 (SUCTIONS) ×2 IMPLANT
UNDERPAD 30X30 INCONTINENT (UNDERPADS AND DIAPERS) ×3 IMPLANT
WATER STERILE IRR 1000ML POUR (IV SOLUTION) ×3 IMPLANT
YANKAUER SUCT BULB TIP NO VENT (SUCTIONS) ×3 IMPLANT

## 2014-07-06 NOTE — Discharge Instructions (Signed)
KEEP BANDAGE CLEAN AND DRY CALL OFFICE FOR F/U APPT [id number] IN 6 DAYS KEEP HAND ELEVATED ABOVE HEART OK TO APPLY ICE TO OPERATIVE AREA CONTACT OFFICE IF ANY WORSENING PAIN OR CONCERNS. Extensor Pollicis Longus Tendinitis Extensor pollicis longus (EPL) tendonitis is a condition in which the EPL tendon lining (sheath) becomes inflamed. This causes pain on the thumb side (radial side) of the back of the wrist. The EPL tendon attaches the EPL muscle to the bone. The EPL muscle straightens the thumb. The tendon lining secretes a lubricant that allows the EPL to glide smoothly. When the lining becomes inflamed, the tendon can not glide smoothly, which causes pain. There are three grades of EPL tendonitis. A grade 1 strain is a mild strain, where the tendon experiences a slight pull, but no obvious tearing (microscopic tearing). There is no loss of strength, and the tendon is the correct length. Grade 2 strains are a moderate strain. There is tearing of tendon fibers within the tendon or where the tendon meets the bone or muscle. Tearing of the tendon causes the length of the whole muscle-tendon-bone unit to increase. A grade 2 injury usually results in decreased strength. A grade 3 strain is a complete rupture of the tendon.  CAUSES   EPL tendinitis is often an overuse injury and caused by repeated motions of the hand and wrist, due to friction of the tendon within the lining.  Sudden increase in activity or change in activity. SYMPTOMS   Vague, diffuse pain and tenderness over the thumb side of the back of the wrist.  Pain that gets worse when straightening the thumb.  Locking, catching, or triggering of the thumb joint.  Limited motion of the thumb.  Little or no swelling, warmth, or redness of the back of the wrist.  Crackling sound (crepitation) when the tendon or wrist is moved or touched. RISK INCREASES WITH:  Sports that involve repeated hand and wrist motions (tennis, golf,  bowling).  Previous wrist injury.  Heavy labor.  Poor wrist strength and flexibility.  Failure to warm up properly before activity. PREVENTION   Warm up and stretch properly before activity.  Allow time for adequate rest and recovery between practices and competition.  Maintain physical fitness:  Forearm, wrist, and hand flexibility.  Muscle strength and endurance.  Learn and use proper exercise technique. PROGNOSIS  This condition is often curable within 6 weeks, if treated properly with non-surgical treatment and resting the affected area. Surgery may be advised. RELATED COMPLICATIONS   Longer healing time, if not properly treated, or if not given adequate time to heal.  Chronic inflammation of the EPL tendon, causing pain.  Recurring of symptoms, especially if activity is resumed too soon.  Risks of surgery: infection, bleeding, injury to nerves (numbness of the thumb), continued pain, incomplete release of the tendon lining, recurring symptoms, cutting of the tendon, thumb weakness, thumb stiffness, and inability to straighten the thumb. TREATMENT  Treatment first involves ice and medicine to reduce pain and inflammation. Stretching and strengthening exercises, along with activity modification, may be advised to reduce painful symptoms. For certain cases, a brace, splint, or taping may be advised, to reduce wrist movement during activity. Your caregiver may recommend a corticosteroid injection to reduce inflammation. If non-surgical treatment is unsuccessful, surgery may be advised to release the inflamed tendon. If the tendon is torn, it may require repair or replacement (reconstruction). MEDICATION   If pain medicine is needed, nonsteroidal anti-inflammatory medicines, (aspirin and ibuprofen), or other  minor pain relievers (acetaminophen), are often advised.  Do not take pain medicine for 7 days before surgery.  Prescription pain relievers are usually only prescribed  after surgery. Use only as directed and only as much as you need.  Cortisone injections reduce inflammation. However, these are used only in extreme cases, because there is a limit to the number of times they may be given. These injections may weaken muscle and tendon tissue. Anesthetics also temporarily relieve pain. COLD THERAPY  Cold treatment (icing) relieves pain and reduces inflammation. Cold treatment should be applied for 10 to 15 minutes every 2 to 3 hours, and immediately after activity that aggravates your symptoms. Use ice packs or an ice massage. SEEK MEDICAL CARE IF:   Symptoms get worse or do not improve in 2 to 4 weeks, despite treatment.  You experience pain, numbness, or coldness in the hand.  Blue, gray, or dark color appears in the fingernails.  Any of the following occur after surgery: increased pain, swelling, redness, drainage of fluids, bleeding in the affected area, or signs of infection.  New, unexplained symptoms develop. (Drugs used in treatment may produce side effects.) Document Released: 12/21/2004 Document Revised: 03/15/2011 Document Reviewed: 04/04/2008 [facility] Patient Information 2015 [place], [place]. This information is not intended to replace advice given to you by your health care provider. Make sure you discuss any questions you have with your health care provider.  Fingertip Infection When an infection is around the nail, it is called a paronychia. When it appears over the tip of the finger, it is called a felon. These infections are due to minor injuries or cracks in the skin. If they are not treated properly, they can lead to bone infection and permanent damage to the fingernail. Incision and drainage is necessary if a pus pocket (an abscess) has formed. Antibiotics and pain medicine may also be needed. Keep your hand elevated for the next 2-3 days to reduce swelling and pain. If a pack was placed in the abscess, it should be removed in 1-2 days by your  caregiver. Soak the finger in warm water for 20 minutes 4 times daily to help promote drainage. Keep the hands as dry as possible. Wear protective gloves with cotton liners. See your caregiver for follow-up care as recommended.  HOME CARE INSTRUCTIONS   Keep wound clean, dry and dressed as suggested by your caregiver.  Soak in warm salt water for fifteen minutes, four times per day for bacterial infections.  Your caregiver will prescribe an antibiotic if a bacterial infection is suspected. Take antibiotics as directed and finish the prescription, even if the problem appears to be improving before the medicine is gone.  Only take over-the-counter or prescription medicines for pain, discomfort, or fever as directed by your caregiver. SEEK IMMEDIATE MEDICAL CARE IF:  There is redness, swelling, or increasing pain in the wound.  Pus or any other unusual drainage is coming from the wound.  An unexplained oral temperature above 102 F (38.9 C) develops.  You notice a foul smell coming from the wound or dressing. MAKE SURE YOU:   Understand these instructions.  Monitor your condition.  Contact your caregiver if you are getting worse or not improving. Document Released: 01/29/2004 Document Revised: 03/15/2011 Document Reviewed: 01/25/2008 [facility] Patient Information 2015 [place], [place]. This information is not intended to replace advice given to you by your health care provider. Make sure you discuss any questions you have with your health care provider.

## 2014-07-06 NOTE — Anesthesia Procedure Notes (Signed)
Procedure Name: Intubation Date/Time: 07/06/2014 4:02 PM Performed by: Brien MatesMAHONY, Bentley Haralson D Pre-anesthesia Checklist: Patient identified, Emergency Drugs available, Suction available, Patient being monitored and Timeout performed Patient Re-evaluated:Patient Re-evaluated prior to inductionOxygen Delivery Method: Circle system utilized Preoxygenation: Pre-oxygenation with 100% oxygen Intubation Type: IV induction Ventilation: Mask ventilation without difficulty Laryngoscope Size: Miller and 2 Grade View: Grade I Tube type: Oral Tube size: 7.5 mm Number of attempts: 1 Airway Equipment and Method: Stylet Placement Confirmation: ETT inserted through vocal cords under direct vision,  positive ETCO2 and breath sounds checked- equal and bilateral Secured at: 23 cm Tube secured with: Tape Dental Injury: Teeth and Oropharynx as per pre-operative assessment

## 2014-07-06 NOTE — Transfer of Care (Signed)
Immediate Anesthesia Transfer of Care Note  Patient: Gary Newton  Procedure(s) Performed: Procedure(s): IRRIGATION AND DEBRIDEMENT MIDDLE FINGER (Left)  Patient Location: PACU  Anesthesia Type:General  Level of Consciousness: sedated  Airway & Oxygen Therapy: Patient Spontanous Breathing and Patient connected to face mask oxygen  Post-op Assessment: Report given to RN and Post -op Vital signs reviewed and stable  Post vital signs: Reviewed and stable  Last Vitals:  Filed Vitals:   07/06/14 1453  BP: 119/66  Pulse: 61  Temp: 36.4 C  Resp: 16    Complications: No apparent anesthesia complications

## 2014-07-06 NOTE — ED Provider Notes (Signed)
CSN: [id number]     Arrival date & time 07/06/14  1142 History  This chart was scribed for Gary StraussMercedes Camprubi-Soms, PA-C, working with Gary MuldersScott Zackowski, MD by Chestine SporeSoijett Newton, ED Scribe. The patient was seen in room TR10C/TR10C at 12:02 PM.    Chief Complaint  Patient presents with  . Finger Injury      Patient is a 29 y.o. male presenting with hand injury. The history is provided by the patient. No language interpreter was used.  Hand Injury Location:  Finger Time since incident:  1 week Injury: yes   Mechanism of injury comment:  Accidently cut himself Finger location:  L middle finger Pain details:    Quality:  Throbbing   Radiates to:  Does not radiate   Severity:  Moderate   Onset quality:  Gradual   Duration:  1 week   Timing:  Constant   Progression:  Worsening Chronicity:  New Dislocation: no   Foreign body present:  Unable to specify Tetanus status:  Up to date Relieved by:  Nothing Worsened by:  Movement Ineffective treatments: keflex. Associated symptoms: decreased range of motion and swelling   Associated symptoms: no fever, no muscle weakness, no numbness and no tingling     Gary Newton is a 29 y.o. male with no significant PMHx, who presents to the Emergency department complaining of left middle finger injury onset 1 week ago. Pt reports that he cut himself accidentally with an unknown object 1 week ago and [facility] where he received a tetanus injection and abx but no laceration repair was needed because of the healing superficial wounds. Pt reports that now since he was seen he is having swollen and redness to the finger. Pt's pain is 10/10, constant, throbbing, and it does not radiate. He reports that movement worsens his pain. He notes that he has tried keflex x 3 days and hot water soaks with no relief for his symptoms. Pt notes that he is having associated symptoms of warmth. He denies red streaking, fever, chills, drainage, CP, SOB,  abdominal pain, n/v/d/c, hematuria, dysuria, numbness, tingling, weakness, and any other symptoms. Pt denies medical of HIV or DM.     History reviewed. No pertinent past medical history. History reviewed. No pertinent past surgical history. No family history on file. History  Substance Use Topics  . Smoking status: Current Every Day Smoker -- 0.50 packs/day    Types: Cigarettes  . Smokeless tobacco: Not on file  . Alcohol Use: Yes     Comment: occ    Review of Systems  Constitutional: Negative for fever and chills.  HENT: Negative for sore throat.   Respiratory: Negative for shortness of breath.   Cardiovascular: Negative for chest pain.  Gastrointestinal: Negative for nausea, vomiting, abdominal pain, diarrhea and constipation.  Genitourinary: Negative for dysuria and hematuria.  Musculoskeletal: Positive for joint swelling (left middle finger) and arthralgias (left middle finger).  Skin: Positive for color change and wound (prior laceration).  Allergic/Immunologic: Negative for immunocompromised state.  Neurological: Negative for weakness and numbness.   A complete 10 system review of systems was obtained and all systems are negative except as noted in the HPI and PMH.      Allergies  Review of patient's allergies indicates no known allergies.  Home Medications   Prior to Admission medications   Medication Sig Start Date End Date Taking? Authorizing Provider  amoxicillin (AMOXIL) 500 MG capsule Take 2 capsules (1,000 mg total) by  mouth 2 (two) times daily. 05/19/14   Dione Booze, MD  cephALEXin (KEFLEX) 500 MG capsule Take 1 capsule (500 mg total) by mouth 4 (four) times daily. 07/04/14   Hayden Rasmussen, NP  HYDROcodone-acetaminophen (NORCO/VICODIN) 5-325 MG per tablet Take 1 tablet by mouth every 4 (four) hours as needed. 07/04/14   Hayden Rasmussen, NP  oxyCODONE-acetaminophen (PERCOCET) 5-325 MG per tablet Take 1 tablet by mouth every 4 (four) hours as needed for moderate pain.  05/19/14   Dione Booze, MD   BP 129/70 mmHg  Pulse 90  Temp(Src) 98.2 F (36.8 C) (Oral)  Resp 18  SpO2 98% Physical Exam  Constitutional: He is oriented to person, place, and time. Vital signs are normal. He appears well-developed and well-nourished.  Non-toxic appearance. No distress.  Afebrile, nontoxic, NAD  HENT:  Head: Normocephalic and atraumatic.  Mouth/Throat: Mucous membranes are normal.  Eyes: Conjunctivae and EOM are normal. Right eye exhibits no discharge. Left eye exhibits no discharge.  Neck: Normal range of motion. Neck supple.  Cardiovascular: Normal rate and intact distal pulses.   Pulmonary/Chest: Effort normal. No respiratory distress.  Abdominal: Normal appearance. He exhibits no distension.  Musculoskeletal:       Left hand: He exhibits decreased range of motion, tenderness, laceration (healed puncture wound over PIP) and swelling. He exhibits normal two-point discrimination and normal capillary refill. Normal sensation noted. Normal strength noted.  Left third digit with swelling and erythema, induration, warmth, and TTP along the flexor tendon sheath along the palmar aspect of the digit. Pt holding finger in slight flexion with pain upon passive extension. ROM limited at DIP and PIP joints due to swelling and pain. Cap refill brisk and present. Sensation grossly intact. Able to wiggle all digits.  Neurological: He is alert and oriented to person, place, and time. He has normal strength. No sensory deficit.  Skin: Skin is warm, dry and intact. No rash noted. There is erythema.  Healed puncture wound over PIP of left third digit, with erythema and warmth as noted above.   Psychiatric: He has a normal mood and affect.  Nursing note and vitals reviewed.   ED Course  Procedures (including critical care time) DIAGNOSTIC STUDIES: Oxygen Saturation is 98% on RA, nl by my interpretation.    COORDINATION OF CARE: 12:10 PM-Discussed treatment plan which includes consult  to hand surgeon with pt at bedside and pt agreed to plan.   Labs Review Labs Reviewed  BASIC METABOLIC PANEL - Abnormal; Notable for the following:    Creatinine, Ser 1.29 (*)    All other components within normal limits  CBC WITH DIFFERENTIAL/PLATELET    Imaging Review No results found.   EKG Interpretation None      MDM   Final diagnoses:  Flexor tenosynovitis of finger  Finger infection    29 y.o. male here with L middle finger pain and swelling/erythema/warmth, induration, and tenderness over flexor tendon sheath, pain with passive extension concerning for flexor tenosynovitis. Has been on keflex x3 days with worsening of symptoms. Tetanus was already updated. Will consult hand surgery. Will give IM morphine.  I personally performed the services described in this documentation, which was scribed in my presence. The recorded information has been reviewed and is accurate.  12:48 PM Dr. Melvyn Novas will come eval pt. Asked for basic labs and that we keep him NPO.   3:18 PM Dr. Melvyn Novas taking pt to surgery. Please see his note for further documentation of care.   Makila Colombe Camprubi-Soms, PA-C  07/06/14 1518 

## 2014-07-06 NOTE — ED Provider Notes (Signed)
Medical screening examination/treatment/procedure(s) were conducted as a shared visit with non-physician practitioner(s) and myself.  I personally evaluated the patient during the encounter.   EKG Interpretation None       The patient seen by me. [age]-year-old male complaining of pains to the left middle finger distal phalanx. Patient had an injury there with a break in the skin about a week ago. Patient's been on Keflex antibody from an urgent care for the past 3 days. Finger seems to be getting worse. Lots of swelling of to the distal aspect of the finger from the PIP joint distally. Pain with range of motion no tenderness over the tendon sheaths in the palmar aspect of the hand. Since patient has been failing treatment with Tobi Bastosnna bodyaches probably will require admission. Also consultation with hand surgery is a appropriate. May need to have something opened and drained.  Patient's tetanus shot is up-to-date.  Vanetta MuldersScott Junius Faucett, MD 07/06/14 1304

## 2014-07-06 NOTE — ED Notes (Signed)
Pt states cut L middle digit of L hand 1 week ago.  Pt is still taking keflex he was prescribed last week for the injury.  Finger now swollen and red.

## 2014-07-06 NOTE — Op Note (Signed)
NAME:  Gary Newton, Gary Newton             ACCOUNT NO.:  [id number]  MEDICAL RECORD NO.:  [id number]  LOCATION:  [facility]                         FACILITY:  [facility]  PHYSICIAN:  Sharma CovertFred W. Kimarie Coor IV, M.D.DATE OF BIRTH:  01/10/1985  DATE OF PROCEDURE:  07/06/2014 DATE OF DISCHARGE:                              OPERATIVE REPORT   PREOPERATIVE DIAGNOSIS:  Left long finger deep finger abscess.  POSTOPERATIVE DIAGNOSIS:  Left long finger deep finger abscess.  ATTENDING PHYSICIAN:  Sharma CovertFred W. Bodhi Stenglein, M.D., who scrubbed and present for the entire procedure.  ASSISTANT SURGEON:  None.  ANESTHESIA:  General via LMA.  SURGICAL PROCEDURE: 1. Incision and drainage of left long finger deep abscess. 2. Left long finger flexor sheath and drainage.  SURGICAL INDICATIONS:  Gary Newton is a 29 year old gentleman, who sustained a penetrating injury to his left long finger, presented with worsening pain and swelling.  The patient was seen and evaluated and recommend to undergo the above procedure.  Risks, benefits, and alternatives were discussed in detail with the patient.  Signed informed consent was obtained.  Risks include, but not limited to bleeding, infection, damage to nearby nerves, arteries, or tendons; loss of motion of the wrist and digits, incomplete relief of symptoms, and need for further surgical intervention.  DESCRIPTION OF PROCEDURE:  The patient was properly identified in the preoperative holding area and marked with a permanent marker made on left long finger to indicate correct operative site.  The patient was then brought back to the operating room, placed supine on the anesthesia room table, where the general anesthetic was administered.  Preoperative antibiotics were held prior to wound cultures being taken.  A well- padded tourniquet was placed on left brachium and sealed with 1000 drape.  Left upper extremity was then prepped and draped in normal sterile fashion.  Time-out was called,  correct site was identified, and procedure then begun.  Attention was then turned to the left long finger and made directly over the distal interphalangeal joint.  The volar small Bruner incision was made, skin flaps were then raised.  The limb was then elevated and tourniquet insufflated.  Dissection was carried down through skin and subcutaneous tissues.  Careful protection of the neurovascular bundles were then done and the deep abscess was encountered along the course of the flexor sheath and drainage of the abscess was then carried out.  Debridement of necrotic tissue was then done with a rongeur and sharp scissors.  The flexor sheath was then opened up around the region of the A5 pulley and this was then opened up and irrigated.  After drainage of the flexor sheath and debridement of the abscess area, the wound was thoroughly irrigated with the Cysto tubing saline solution.  The skin was then loosely reapproximated with 4- 0 Prolene sutures.  Xeroform dressing, sterile compressive bandage then applied.  The patient tolerated the procedure well and returned to recovery room in good condition after being placed in a small finger splint.  POSTPROCEDURE PLAN:  Patient will be discharged home, continue on oral antibiotics.  Patient will be seen back in 3-4 days for wound check, and follow his culture results.  I will need to look at  his culture results at every visit.  INTRAOPERATIVE CULTURES:  Aerobic and anaerobic cultures.     Madelynn Done, M.D.     FWO/MEDQ  D:  07/06/2014  T:  07/06/2014  Job:  [id number]

## 2014-07-06 NOTE — H&P (Signed)
Gary Newton is an 29 y.o. male.   Chief Complaint: LEFT MIDDLE FINGER PAIN HPI: Patient is a 29 y.o. male presenting with hand injury. The history is provided by the patient. No language interpreter was used.  Hand Injury Location: Finger Time since incident: 1 week Injury: yes  Mechanism of injury comment: Accidently cut himself Finger location: L middle finger Pain details:   Quality: Throbbing  Radiates to: Does not radiate  Severity: Moderate  Onset quality: Gradual  Duration: 1 week  Timing: Constant  Progression: Worsening Chronicity: New Dislocation: no  Foreign body present: Unable to specify Tetanus status: Up to date Relieved by: Nothing Worsened by: Movement Ineffective treatments: keflex. Associated symptoms: decreased range of motion and swelling  Associated symptoms: no fever, no muscle weakness, no numbness and no tingling    History reviewed. No pertinent past medical history.  History reviewed. No pertinent past surgical history.  No family history on file. Social History:  reports that he has been smoking Cigarettes.  He has been smoking about 0.50 packs per day. He does not have any smokeless tobacco history on file. He reports that he drinks alcohol. He reports that he does not use illicit drugs.  Allergies: No Known Allergies  Medications Prior to Admission  Medication Sig Dispense Refill  . amoxicillin (AMOXIL) 500 MG capsule Take 2 capsules (1,000 mg total) by mouth 2 (two) times daily. 40 capsule 0  . cephALEXin (KEFLEX) 500 MG capsule Take 1 capsule (500 mg total) by mouth 4 (four) times daily. 28 capsule 0  . HYDROcodone-acetaminophen (NORCO/VICODIN) 5-325 MG per tablet Take 1 tablet by mouth every 4 (four) hours as needed. 12 tablet 0  . oxyCODONE-acetaminophen (PERCOCET) 5-325 MG per tablet Take 1 tablet by mouth every 4 (four) hours as needed for moderate pain. 10 tablet 0    Results for orders placed or  performed during the hospital encounter of 07/06/14 (from the past 48 hour(s))  CBC with Differential     Status: None   Collection Time: 07/06/14  1:01 PM  Result Value Ref Range   WBC 7.6 4.0 - 10.5 K/uL   RBC 5.45 4.22 - 5.81 MIL/uL   Hemoglobin 15.0 13.0 - 17.0 g/dL   HCT 45.5 39.0 - 52.0 %   MCV 83.5 78.0 - 100.0 fL   MCH 27.5 26.0 - 34.0 pg   MCHC 33.0 30.0 - 36.0 g/dL   RDW 13.9 11.5 - 15.5 %   Platelets 247 150 - 400 K/uL   Neutrophils Relative % 68 43 - 77 %   Neutro Abs 5.2 1.7 - 7.7 K/uL   Lymphocytes Relative 19 12 - 46 %   Lymphs Abs 1.5 0.7 - 4.0 K/uL   Monocytes Relative 12 3 - 12 %   Monocytes Absolute 0.9 0.1 - 1.0 K/uL   Eosinophils Relative 1 0 - 5 %   Eosinophils Absolute 0.1 0.0 - 0.7 K/uL   Basophils Relative 0 0 - 1 %   Basophils Absolute 0.0 0.0 - 0.1 K/uL  Basic metabolic panel     Status: Abnormal   Collection Time: 07/06/14  1:01 PM  Result Value Ref Range   Sodium 140 135 - 145 mmol/L   Potassium 4.2 3.5 - 5.1 mmol/L   Chloride 104 101 - 111 mmol/L   CO2 29 22 - 32 mmol/L   Glucose, Bld 67 65 - 99 mg/dL   BUN 11 6 - 20 mg/dL   Creatinine, Ser 1.29 (H) 0.61 -  1.24 mg/dL   Calcium 9.3 8.9 - 10.3 mg/dL   GFR calc non Af Amer >60 >60 mL/min   GFR calc Af Amer >60 >60 mL/min    Comment: (NOTE) The eGFR has been calculated using the CKD EPI equation. This calculation has not been validated in all clinical situations. eGFR's persistently <60 mL/min signify possible Chronic Kidney Disease.    Anion gap 7 5 - 15   No results found.  ROS NO RECENT ILLNESSES OR HOSPITALIZATIONS  Blood pressure 119/66, pulse 61, temperature 97.5 F (36.4 C), temperature source Oral, resp. rate 16, height 6' 1"  (1.854 m), weight 113.399 kg (250 lb), SpO2 100 %. Physical Exam  General Appearance:  Alert, cooperative, no distress, appears stated age  Head:  Normocephalic, without obvious abnormality, atraumatic  Eyes:  Pupils equal, conjunctiva/corneas clear,          Throat: Lips, mucosa, and tongue normal; teeth and gums normal  Neck: No visible masses     Lungs:   respirations unlabored  Chest Wall:  No tenderness or deformity  Heart:  Regular rate and rhythm,  Abdomen:   Soft, non-tender,         Extremities: LEFT HAND: LONG FINGER FLUCTUANT AREA OVER DIP REGION, TTP OVER FLEXOR SHEATH FINGER TIP WARM WELL PERFUSED NO WOUNDS OR EVIDENCE OF INFECTION TO INDEX/RING/SMALL GOOD WRIST AND DIGITAL MOBILITY EXCLUDING LONG FINGER   Pulses: 2+ and symmetric  Skin: Skin color, texture, turgor normal, no rashes or lesions     Neurologic: Normal    Assessment/Plan LEFT LONG FINGER ABSCESS POSSIBLE EARLY TENOSYNOVITIS  LEFT LONG FINGER INCISION AND DRAINAGE AND FLEXOR SHEATH EXPLORATION AND DRAINAGE  R/B/A DISCUSSED WITH PT IN HOSPITAL.  PT VOICED UNDERSTANDING OF PLAN CONSENT SIGNED DAY OF SURGERY PT SEEN AND EXAMINED PRIOR TO OPERATIVE PROCEDURE/DAY OF SURGERY SITE MARKED. QUESTIONS ANSWERED WILL GO HOME FOLLOWING SURGERY  WE ARE PLANNING SURGERY FOR YOUR UPPER EXTREMITY. THE RISKS AND BENEFITS OF SURGERY INCLUDE BUT NOT LIMITED TO BLEEDING INFECTION, DAMAGE TO NEARBY NERVES ARTERIES TENDONS, FAILURE OF SURGERY TO ACCOMPLISH ITS INTENDED GOALS, PERSISTENT SYMPTOMS AND NEED FOR FURTHER SURGICAL INTERVENTION. WITH THIS IN MIND WE WILL PROCEED. I HAVE DISCUSSED WITH THE PATIENT THE PRE AND POSTOPERATIVE REGIMEN AND THE DOS AND DON'TS. PT VOICED UNDERSTANDING AND INFORMED CONSENT SIGNED.  [name] 07/06/2014, 3:18 PM

## 2014-07-06 NOTE — Anesthesia Postprocedure Evaluation (Signed)
Anesthesia Post Note  Patient: Gary MillardJermaine E Newton  Procedure(s) Performed: Procedure(s) (LRB): IRRIGATION AND DEBRIDEMENT MIDDLE FINGER (Left)  Anesthesia type: General  Patient location: PACU  Post pain: Pain level controlled  Post assessment: Post-op Vital signs reviewed  Last Vitals: BP 153/84 mmHg  Pulse 67  Temp(Src) 36.4 C (Oral)  Resp 22  Ht 6\' 1"  (1.854 m)  Wt 250 lb (113.399 kg)  BMI 32.99 kg/m2  SpO2 96%  Post vital signs: Reviewed  Level of consciousness: sedated  Complications: No apparent anesthesia complications

## 2014-07-06 NOTE — Anesthesia Preprocedure Evaluation (Addendum)
Anesthesia Evaluation  Patient identified by MRN, date of birth, ID band Patient awake    Reviewed: Allergy & Precautions, NPO status , Patient's Chart, lab work & pertinent test results  Airway Mallampati: II  TM Distance: >3 FB Neck ROM: Full    Dental no notable dental hx. (+) Teeth Intact, Dental Advisory Given   Pulmonary Current Smoker,  breath sounds clear to auscultation  Pulmonary exam normal       Cardiovascular negative cardio ROS Normal cardiovascular examRhythm:Regular Rate:Normal     Neuro/Psych negative neurological ROS  negative psych ROS   GI/Hepatic negative GI ROS, Neg liver ROS,   Endo/Other  negative endocrine ROS  Renal/GU negative Renal ROS     Musculoskeletal negative musculoskeletal ROS (+)   Abdominal   Peds  Hematology negative hematology ROS (+)   Anesthesia Other Findings   Reproductive/Obstetrics negative OB ROS                            Anesthesia Physical Anesthesia Plan  ASA: II  Anesthesia Plan: General   Post-op Pain Management:    Induction: Intravenous  Airway Management Planned: Oral ETT  Additional Equipment: None  Intra-op Plan:   Post-operative Plan: Extubation in OR  Informed Consent: I have reviewed the patients History and Physical, chart, labs and discussed the procedure including the risks, benefits and alternatives for the proposed anesthesia with the patient or authorized representative who has indicated his/her understanding and acceptance.   Dental advisory given  Plan Discussed with: CRNA, Anesthesiologist and Surgeon  Anesthesia Plan Comments:        Anesthesia Quick Evaluation

## 2014-07-06 NOTE — Brief Op Note (Signed)
07/06/2014  3:20 PM  PATIENT:  Gary MillardJermaine E Newton  29 y.o. male  PRE-OPERATIVE DIAGNOSIS:  LACERATION OF FINGER  POST-OPERATIVE DIAGNOSIS:  LACERATION OF FINGER  PROCEDURE:  Procedure(s): IRRIGATION AND DEBRIDEMENT MIDDLE FINGER (Left)  SURGEON:  Surgeon(s) and Role:    * Bradly BienenstockFred Samayah Novinger, MD - Primary  PHYSICIAN ASSISTANT:   ASSISTANTS: none   ANESTHESIA:   general  EBL:     BLOOD ADMINISTERED:none  DRAINS: none   LOCAL MEDICATIONS USED:  NONE  SPECIMEN:  No Specimen  DISPOSITION OF SPECIMEN:  N/A  COUNTS:  YES  TOURNIQUET:    DICTATION: .Other Dictation: Dictation Number [id number]  PLAN OF CARE: Discharge to home after PACU  PATIENT DISPOSITION:  PACU - hemodynamically stable.   Delay start of Pharmacological VTE agent (>24hrs) due to surgical blood loss or risk of bleeding: not applicable

## 2014-07-09 ENCOUNTER — Encounter (HOSPITAL_COMMUNITY): Payer: Self-pay | Admitting: Orthopedic Surgery

## 2014-07-09 LAB — CULTURE, ROUTINE-ABSCESS

## 2014-07-11 LAB — ANAEROBIC CULTURE

## 2015-04-21 ENCOUNTER — Encounter (HOSPITAL_COMMUNITY): Payer: Self-pay | Admitting: *Deleted

## 2015-04-21 ENCOUNTER — Emergency Department (HOSPITAL_COMMUNITY)
Admission: EM | Admit: 2015-04-21 | Discharge: 2015-04-21 | Disposition: A | Discharge status: Home / Self Care | Payer: No Typology Code available for payment source | Attending: Emergency Medicine | Admitting: Emergency Medicine

## 2015-04-21 DIAGNOSIS — R197 Diarrhea, unspecified: Secondary | ICD-10-CM | POA: Insufficient documentation

## 2015-04-21 DIAGNOSIS — R112 Nausea with vomiting, unspecified: Secondary | ICD-10-CM | POA: Insufficient documentation

## 2015-04-21 DIAGNOSIS — R1013 Epigastric pain: Secondary | ICD-10-CM | POA: Insufficient documentation

## 2015-04-21 DIAGNOSIS — Z7982 Long term (current) use of aspirin: Secondary | ICD-10-CM | POA: Insufficient documentation

## 2015-04-21 DIAGNOSIS — F17210 Nicotine dependence, cigarettes, uncomplicated: Secondary | ICD-10-CM | POA: Insufficient documentation

## 2015-04-21 LAB — COMPREHENSIVE METABOLIC PANEL
ALK PHOS: 52 U/L (ref 38–126)
ALT: 28 U/L (ref 17–63)
ANION GAP: 11 (ref 5–15)
AST: 23 U/L (ref 15–41)
Albumin: 3.9 g/dL (ref 3.5–5.0)
BUN: 11 mg/dL (ref 6–20)
CALCIUM: 9.1 mg/dL (ref 8.9–10.3)
CO2: 26 mmol/L (ref 22–32)
Chloride: 100 mmol/L — ABNORMAL LOW (ref 101–111)
Creatinine, Ser: 1.33 mg/dL — ABNORMAL HIGH (ref 0.61–1.24)
GFR calc Af Amer: >60 mL/min (ref >60)
Glucose, Bld: 115 mg/dL — ABNORMAL HIGH (ref 65–99)
Potassium: 3.5 mmol/L (ref 3.5–5.1)
SODIUM: 137 mmol/L (ref 135–145)
Total Bilirubin: 0.5 mg/dL (ref 0.3–1.2)
Total Protein: 7.3 g/dL (ref 6.5–8.1)

## 2015-04-21 LAB — CBC
HCT: 49.2 % (ref 39.0–52.0)
HEMOGLOBIN: 15.9 g/dL (ref 13.0–17.0)
MCH: 26.8 pg (ref 26.0–34.0)
MCHC: 32.3 g/dL (ref 30.0–36.0)
MCV: 82.8 fL (ref 78.0–100.0)
PLATELETS: 212 10*3/uL (ref 150–400)
RBC: 5.94 MIL/uL — AB (ref 4.22–5.81)
RDW: 13.6 % (ref 11.5–15.5)
WBC: 4.9 10*3/uL (ref 4.0–10.5)

## 2015-04-21 LAB — LIPASE, BLOOD: LIPASE: 17 U/L (ref 11–51)

## 2015-04-21 MED ORDER — ONDANSETRON HCL 4 MG PO TABS: 4.0000 mg | ORAL_TABLET | Freq: Four times a day (QID) | ORAL | Status: DC

## 2015-04-21 MED ORDER — SODIUM CHLORIDE 0.9 % IV BOLUS (SEPSIS)
1000.0000 mL | Freq: Once | INTRAVENOUS | Status: AC
  Administered 2015-04-21: 1000 mL via INTRAVENOUS

## 2015-04-21 MED ORDER — ONDANSETRON 4 MG PO TBDP
4.0000 mg | ORAL_TABLET | Freq: Once | ORAL | Status: AC | PRN
  Administered 2015-04-21: 4 mg via ORAL

## 2015-04-21 MED ORDER — ONDANSETRON HCL 4 MG/2ML IJ SOLN
4.0000 mg | Freq: Once | INTRAMUSCULAR | Status: AC
  Administered 2015-04-21: 4 mg via INTRAVENOUS
  Filled 2015-04-21: qty 2

## 2015-04-21 MED ORDER — MORPHINE SULFATE (PF) 4 MG/ML IV SOLN
4.0000 mg | Freq: Once | INTRAVENOUS | Status: AC
  Administered 2015-04-21: 4 mg via INTRAVENOUS
  Filled 2015-04-21: qty 1

## 2015-04-21 MED ORDER — ONDANSETRON 4 MG PO TBDP
ORAL_TABLET | ORAL | Status: AC
  Filled 2015-04-21: qty 1

## 2015-04-21 NOTE — ED Notes (Signed)
Pt ambulates independently and with steady gait at time of discharge. Discharge instructions and follow up information reviewed with patient. No other questions or concerns voiced at this time. RX x 1. 

## 2015-04-21 NOTE — ED Provider Notes (Signed)
CSN: [id number]     Arrival date & time 04/21/15  1121 History  By signing my name below, I, Iona Beard, attest that this documentation has been prepared under the direction and in the presence of Kristie Cowman,   Electronically Signed: Iona Beard, ED Scribe 04/21/2015 at 2:53 PM.   Chief Complaint  Patient presents with  . Abdominal Pain  . Emesis    The history is provided by the patient. No language interpreter was used.   HPI Comments: Gary Newton is a 30 y.o. male who presents to the Emergency Department complaining of gradual onset, constant, generalized abdominal pain, ongoing for one day. He states he "drank a lot of liquor" on 04/19/2015 before his symptoms began. Pt reports associated diarrhea x numerous episodes, emesis x numerous episodes, and loss of appetite. No sick contact noted. No other associated symptoms noted. No other worsening or alleviating factors noted. Pt denies fever, hematemesis, hematochezia, dysuria, urinary frequency, recent travel, or any other pertinent symptoms.   History reviewed. No pertinent past medical history. Past Surgical History  Procedure Laterality Date  . I&d extremity Left 07/06/2014    Procedure: IRRIGATION AND DEBRIDEMENT MIDDLE FINGER;  Surgeon: Bradly Bienenstock, MD;  Location: [facility];  Service: Orthopedics;  Laterality: Left;   No family history on file. Social History  Substance Use Topics  . Smoking status: Current Every Day Smoker -- 0.50 packs/day    Types: Cigarettes  . Smokeless tobacco: None  . Alcohol Use: Yes     Comment: occ    Review of Systems A complete 10 system review of systems was obtained and all systems are negative except as noted in the HPI and PMH.    Allergies  Review of patient's allergies indicates no known allergies.  Home Medications   Prior to Admission medications   Medication Sig Start Date End Date Taking? Authorizing Provider  cephALEXin (KEFLEX) 500 MG capsule Take 1  capsule (500 mg total) by mouth 4 (four) times daily. 07/04/14   Hayden Rasmussen, NP  doxycycline (VIBRA-TABS) 100 MG tablet Take 1 tablet (100 mg total) by mouth 2 (two) times daily. 07/06/14   Bradly Bienenstock, MD  oxyCODONE-acetaminophen (PERCOCET) 5-325 MG per tablet Take 1 tablet by mouth every 4 (four) hours as needed for moderate pain. 07/06/14   Bradly Bienenstock, MD   BP 148/84 mmHg  Pulse 65  Temp(Src) 98.7 F (37.1 C) (Oral)  Resp 16  Ht  (1.854 m)  Wt 244 lb (110.678 kg)  BMI 32.20 kg/m2  SpO2 100% Physical Exam  Constitutional: He appears well-developed and well-nourished. No distress.  HENT:  Head: Normocephalic and atraumatic.  Mouth/Throat: Mucous membranes are not dry.  Eyes: Conjunctivae and EOM are normal.  Neck: Neck supple. No tracheal deviation present.  Cardiovascular: Normal rate.   Pulmonary/Chest: Effort normal. No respiratory distress.  Abdominal: He exhibits no distension and no mass. There is tenderness. There is no rebound and no guarding.  Epigastric TTP.   Musculoskeletal: Normal range of motion.  Neurological: He is alert.  Skin: Skin is warm and dry.  Psychiatric: He has a normal mood and affect. His behavior is normal.    ED Course  Procedures (including critical care time) DIAGNOSTIC STUDIES: Oxygen Saturation is 100% on RA, normal by my interpretation.    COORDINATION OF CARE: 1:47 PM-Discussed treatment plan which includes zofran-odt, lipase, CMP, and CBC with pt at bedside and pt agreed to plan.    Labs Review Labs Reviewed  COMPREHENSIVE METABOLIC PANEL - Abnormal; Notable for the following:    Chloride 100 (*)    Glucose, Bld 115 (*)    Creatinine, Ser 1.33 (*)    All other components within normal limits  CBC - Abnormal; Notable for the following:    RBC 5.94 (*)    All other components within normal limits  LIPASE, BLOOD    Imaging Review No results found. I have personally reviewed and evaluated these lab results as part of my  medical decision-making.   EKG Interpretation None     2:15 PM Reassessed patient.  He reports symptoms have improved at this time. Will fluid challenge. 2:40 PM Patient tolerating PO liquids.  Abdomen soft with mild diffuse tenderness to palpation. MDM   Final diagnoses:  None  Patient presents today with nausea, vomiting, diarrhea, and diffuse abdominal pain.  On exam, mild epigastric abdominal tenderness.  No rebound or guarding.  Labs unremarkable aside from mildly elevated Creatine.  Patient given IVF.  Nausea improved while in the ED after given Zofran.  Patient tolerating PO liquids.  Patient stable for discharge.  Return precautions given.    I personally performed the services described in this documentation, which was scribed in my presence. The recorded information has been reviewed and is accurate.   Santiago GladHeather Nalu Troublefield, PA-C 04/21/15 1612  Doug SouSam Jacubowitz, MD 04/21/15 1725

## 2015-04-21 NOTE — ED Notes (Signed)
Pt states he drank "a lot of liquor" on Sat.  C/o of generalized abdominal pain, vomiting and diarrhea since Sun am.  Denies fevers.

## 2015-04-21 NOTE — ED Notes (Signed)
Pt tolerating oral intake.

## 2015-07-24 ENCOUNTER — Ambulatory Visit (HOSPITAL_COMMUNITY)
Admission: EM | Admit: 2015-07-24 | Discharge: 2015-07-24 | Disposition: A | Discharge status: Home / Self Care | Payer: BLUE CROSS/BLUE SHIELD | Attending: Emergency Medicine | Admitting: Emergency Medicine

## 2015-07-24 ENCOUNTER — Encounter (HOSPITAL_COMMUNITY): Payer: Self-pay | Admitting: Emergency Medicine

## 2015-07-24 DIAGNOSIS — F17210 Nicotine dependence, cigarettes, uncomplicated: Secondary | ICD-10-CM | POA: Insufficient documentation

## 2015-07-24 DIAGNOSIS — R1010 Upper abdominal pain, unspecified: Secondary | ICD-10-CM

## 2015-07-24 DIAGNOSIS — Z79899 Other long term (current) drug therapy: Secondary | ICD-10-CM | POA: Diagnosis not present

## 2015-07-24 DIAGNOSIS — R109 Unspecified abdominal pain: Secondary | ICD-10-CM | POA: Diagnosis present

## 2015-07-24 LAB — POCT URINALYSIS DIP (DEVICE)
BILIRUBIN URINE: NEGATIVE
GLUCOSE, UA: NEGATIVE mg/dL
Hgb urine dipstick: NEGATIVE
KETONES UR: NEGATIVE mg/dL
LEUKOCYTES UA: NEGATIVE
NITRITE: NEGATIVE
PH: 6 (ref 5.0–8.0)
Protein, ur: NEGATIVE mg/dL
Specific Gravity, Urine: 1.025 (ref 1.005–1.030)
Urobilinogen, UA: 0.2 mg/dL (ref 0.0–1.0)

## 2015-07-24 MED ORDER — HYDROCODONE-ACETAMINOPHEN 5-325 MG PO TABS: 1.0000 | ORAL_TABLET | ORAL | Status: DC | PRN

## 2015-07-24 NOTE — Discharge Instructions (Signed)
Flank Pain °Flank pain refers to pain that is located on the side of the body between the upper abdomen and the back. The pain may occur over a short period of time (acute) or may be long-term or reoccurring (chronic). It may be mild or severe. Flank pain can be caused by many things. °CAUSES  °Some of the more common causes of flank pain include: °· Muscle strains.   °· Muscle spasms.   °· A disease of your spine (vertebral disk disease).   °· A lung infection (pneumonia).   °· Fluid around your lungs (pulmonary edema).   °· A kidney infection.   °· Kidney stones.   °· A very painful skin rash caused by the chickenpox virus (shingles).   °· Gallbladder disease.   °HOME CARE INSTRUCTIONS  °Home care will depend on the cause of your pain. In general, °· Rest as directed by your caregiver. °· Drink enough fluids to keep your urine clear or pale yellow. °· Only take over-the-counter or prescription medicines as directed by your caregiver. Some medicines may help relieve the pain. °· Tell your caregiver about any changes in your pain. °· Follow up with your caregiver as directed. °SEEK IMMEDIATE MEDICAL CARE IF:  °· Your pain is not controlled with medicine.   °· You have new or worsening symptoms. °· Your pain increases.   °· You have abdominal pain.   °· You have shortness of breath.   °· You have persistent nausea or vomiting.   °· You have swelling in your abdomen.   °· You feel faint or pass out.   °· You have blood in your urine. °· You have a fever or persistent symptoms for more than 2-3 days. °· You have a fever and your symptoms suddenly get worse. °MAKE SURE YOU:  °· Understand these instructions. °· Will watch your condition. °· Will get help right away if you are not doing well or get worse. °  °This information is not intended to replace advice given to you by your health care provider. Make sure you discuss any questions you have with your health care provider. °  °Document Released: 02/11/2005 Document  Revised: 09/15/2011 Document Reviewed: 08/05/2011 °Elsevier Interactive Patient Education ©2016 [employer] ° °

## 2015-07-24 NOTE — ED Notes (Signed)
Pt has been suffering from right lateral mid back pain all afternoon.  Pt denies any other symptoms.

## 2015-07-24 NOTE — ED Provider Notes (Signed)
CSN: [id number]     Arrival date & time 07/24/15  1454 History   First MD Initiated Contact with Patient 07/24/15 1603     Chief Complaint  Patient presents with  . Back Pain   (Consider location/radiation/quality/duration/timing/severity/associated sxs/prior Treatment) HPI History obtained from patient:  Pt presents with the cc of:  Flank pain Duration of symptoms: Today Treatment prior to arrival: None Context: Awoke with right flank pain no known injury denies hurting himself at work Other symptoms include: No dysuria does admit to recent change in sexual partner Pain score: 2-3 FAMILY HISTORY: Hypertension    History reviewed. No pertinent past medical history. Past Surgical History  Procedure Laterality Date  . I&d extremity Left 07/06/2014    Procedure: IRRIGATION AND DEBRIDEMENT MIDDLE FINGER;  Surgeon: Bradly BienenstockFred Ortmann, MD;  Location: [facility] OR;  Service: Orthopedics;  Laterality: Left;   History reviewed. No pertinent family history. Social History  Substance Use Topics  . Smoking status: Current Every Day Smoker -- 0.50 packs/day    Types: Cigarettes  . Smokeless tobacco: None  . Alcohol Use: Yes     Comment: occ    Review of Systems  Denies: HEADACHE, NAUSEA, ABDOMINAL PAIN, CHEST PAIN, CONGESTION, DYSURIA, SHORTNESS OF BREATH  Allergies  Review of patient's allergies indicates no known allergies.  Home Medications   Prior to Admission medications   Medication Sig Start Date End Date Taking? Authorizing Provider  cephALEXin (KEFLEX) 500 MG capsule Take 1 capsule (500 mg total) by mouth 4 (four) times daily. 07/04/14   Hayden Rasmussenavid Mabe, NP  doxycycline (VIBRA-TABS) 100 MG tablet Take 1 tablet (100 mg total) by mouth 2 (two) times daily. 07/06/14   Bradly BienenstockFred Ortmann, MD  HYDROcodone-acetaminophen (NORCO/VICODIN) 5-325 MG tablet Take 1 tablet by mouth every 4 (four) hours as needed. 07/24/15   Tharon AquasFrank C Britt Theard, PA  ondansetron (ZOFRAN) 4 MG tablet Take 1 tablet (4 mg total) by mouth  every 6 (six) hours. 04/21/15   Heather Laisure, PA-C  oxyCODONE-acetaminophen (PERCOCET) 5-325 MG per tablet Take 1 tablet by mouth every 4 (four) hours as needed for moderate pain. 07/06/14   Bradly BienenstockFred Ortmann, MD   Meds Ordered and Administered this Visit  Medications - No data to display  BP 131/71 mmHg  Pulse 65  Temp(Src) 98.8 F (37.1 C) (Oral)  Resp 16  SpO2 100% No data found.   Physical Exam NURSES NOTES AND VITAL SIGNS REVIEWED. CONSTITUTIONAL: Well developed, well nourished, no acute distress HEENT: normocephalic, atraumatic EYES: Conjunctiva normal NECK:normal ROM, supple, no adenopathy PULMONARY:No respiratory distress, normal effort ABDOMINAL: Soft, ND, NT BS+, No CVAT MUSCULOSKELETAL: Normal ROM of all extremities,  SKIN: warm and dry without rash PSYCHIATRIC: Mood and affect, behavior are normal  ED Course  Procedures (including critical care time)  Labs Review Labs Reviewed  POCT URINALYSIS DIP (DEVICE)  URINE CYTOLOGY ANCILLARY ONLY    Imaging Review No results found.   Visual Acuity Review  Right Eye Distance:   Left Eye Distance:   Bilateral Distance:    Right Eye Near:   Left Eye Near:    Bilateral Near:      Review of laboratory data with patient there is no hematuria and nothing to suggest renal lithiasis. We'll check STD panel analgesia as prescribed patient is discharged home in stable condition   return to work note is provided.   MDM   1. Acute flank pain     Patient is reassured that there are no issues that require transfer  to higher level of care at this time or additional tests. Patient is advised to continue home symptomatic treatment. Patient is advised that if there are new or worsening symptoms to attend the emergency department, contact primary care provider, or return to UC. Instructions of care provided discharged home in stable condition.    THIS NOTE WAS GENERATED USING A VOICE RECOGNITION SOFTWARE PROGRAM. ALL  REASONABLE EFFORTS  WERE MADE TO PROOFREAD THIS DOCUMENT FOR ACCURACY.  I have verbally reviewed the discharge instructions with the patient. A printed AVS was given to the patient.  All questions were answered prior to discharge.      Tharon Aquas, PA 07/24/15 1651

## 2015-07-25 LAB — URINE CYTOLOGY ANCILLARY ONLY
Chlamydia: NEGATIVE
Neisseria Gonorrhea: NEGATIVE

## 2015-09-22 ENCOUNTER — Emergency Department (HOSPITAL_COMMUNITY): Payer: BLUE CROSS/BLUE SHIELD

## 2015-09-22 ENCOUNTER — Encounter (HOSPITAL_COMMUNITY): Payer: Self-pay | Admitting: Vascular Surgery

## 2015-09-22 ENCOUNTER — Emergency Department (HOSPITAL_COMMUNITY)
Admission: EM | Admit: 2015-09-22 | Discharge: 2015-09-22 | Disposition: A | Discharge status: Home / Self Care | Payer: BLUE CROSS/BLUE SHIELD | Attending: Emergency Medicine | Admitting: Emergency Medicine

## 2015-09-22 DIAGNOSIS — Y999 Unspecified external cause status: Secondary | ICD-10-CM | POA: Diagnosis not present

## 2015-09-22 DIAGNOSIS — W230XXA Caught, crushed, jammed, or pinched between moving objects, initial encounter: Secondary | ICD-10-CM | POA: Insufficient documentation

## 2015-09-22 DIAGNOSIS — M79644 Pain in right finger(s): Secondary | ICD-10-CM | POA: Diagnosis present

## 2015-09-22 DIAGNOSIS — F17210 Nicotine dependence, cigarettes, uncomplicated: Secondary | ICD-10-CM | POA: Diagnosis not present

## 2015-09-22 DIAGNOSIS — Y939 Activity, unspecified: Secondary | ICD-10-CM | POA: Diagnosis not present

## 2015-09-22 DIAGNOSIS — Y929 Unspecified place or not applicable: Secondary | ICD-10-CM | POA: Insufficient documentation

## 2015-09-22 MED ORDER — HYDROCODONE-ACETAMINOPHEN 5-325 MG PO TABS: 1.0000 | ORAL_TABLET | Freq: Three times a day (TID) | ORAL | 0 refills | Status: DC | PRN

## 2015-09-22 NOTE — ED Provider Notes (Signed)
[facility]-EMERGENCY DEPT Provider Note   CSN: [id number] Arrival date & time: 09/22/15  1530  By signing my name below, I, Phillis HaggisGabriella Gaje, attest that this documentation has been prepared under the direction and in the presence of Arvilla MeresAshley Korin Setzler, PA-C. Electronically Signed: Phillis HaggisGabriella Gaje, ED Scribe. 09/22/15. 8:21 PM.  History   Chief Complaint Chief Complaint  Patient presents with  . Finger Injury   The history is provided by the patient. No language interpreter was used.   HPI Comments: Gary Newton is a 30 y.o. male who presents to the Emergency Department complaining of a right thumb injury occurring 3 days ago. Pt states that the initial injury occurred after jamming his thumb. He reports worsening pain with bending the joints in the thumb. Pt took ibuprofen to no relief, so he began to take old Percocet from a prior injury. Pt is able to move his thumb but states it is painful. Pt does heavy lifting at work. He is right hand dominant. He denies fever, chills, warmth, redness, wound, swelling, numbness, or weakness.   History reviewed. No pertinent past medical history.  There are no active problems to display for this patient.   Past Surgical History:  Procedure Laterality Date  . I&D EXTREMITY Left 07/06/2014   Procedure: IRRIGATION AND DEBRIDEMENT MIDDLE FINGER;  Surgeon: Bradly BienenstockFred Ortmann, MD;  Location: [facility] OR;  Service: Orthopedics;  Laterality: Left;    OB History    No data available       Home Medications    Prior to Admission medications   Medication Sig Start Date End Date Taking? Authorizing Provider  cephALEXin (KEFLEX) 500 MG capsule Take 1 capsule (500 mg total) by mouth 4 (four) times daily. 07/04/14   Hayden Rasmussenavid Mabe, NP  doxycycline (VIBRA-TABS) 100 MG tablet Take 1 tablet (100 mg total) by mouth 2 (two) times daily. 07/06/14   Bradly BienenstockFred Ortmann, MD  HYDROcodone-acetaminophen (NORCO/VICODIN) 5-325 MG tablet Take 1 tablet by mouth every 8 (eight) hours as needed. 09/22/15    Lona KettleAshley Laurel Karron Goens, PA-C  ondansetron (ZOFRAN) 4 MG tablet Take 1 tablet (4 mg total) by mouth every 6 (six) hours. 04/21/15   Heather Laisure, PA-C  oxyCODONE-acetaminophen (PERCOCET) 5-325 MG per tablet Take 1 tablet by mouth every 4 (four) hours as needed for moderate pain. 07/06/14   Bradly BienenstockFred Ortmann, MD    Family History No family history on file.  Social History Social History  Substance Use Topics  . Smoking status: Current Every Day Smoker    Packs/day: 0.50    Types: Cigarettes  . Smokeless tobacco: Never Used  . Alcohol use Yes     Comment: occ     Allergies   Review of patient's allergies indicates no known allergies.   Review of Systems Review of Systems  Constitutional: Negative for chills and fever.  Musculoskeletal: Positive for arthralgias. Negative for joint swelling.  Skin: Negative for color change and wound.  Neurological: Negative for weakness and numbness.   Physical Exam Updated Vital Signs BP 136/78   Pulse 63   Temp 97.8 F (36.6 C) (Oral)   Resp 18   Ht 6\' 1"  (1.854 m)   Wt 108.9 kg   SpO2 100%   BMI 31.66 kg/m   Physical Exam  Constitutional: He appears well-developed and well-nourished. No distress.  HENT:  Head: Normocephalic and atraumatic.  Eyes: Conjunctivae and EOM are normal. No scleral icterus.  Neck: Normal range of motion. Neck supple.  Cardiovascular: Normal rate.   Pulmonary/Chest:  Effort normal. No respiratory distress.  Musculoskeletal: Normal range of motion.       Right hand: He exhibits no deformity and no swelling. Normal sensation noted. Normal strength noted.  Right thumb: No obvious deformity, erythema, warmth, ecchymosis,or swelling. No TTP. Pain with ROM. Strength and sensation intact; thumb to finger opposition intact.  Neurological: He is alert. He has normal strength. He is not disoriented. No sensory deficit. Coordination and gait normal. GCS eye subscore is 4. GCS verbal subscore is 5. GCS motor subscore is 6.    Skin: Skin is warm and dry. He is not diaphoretic.  Psychiatric: He has a normal mood and affect. His behavior is normal.  Nursing note and vitals reviewed.  ED Treatments / Results  DIAGNOSTIC STUDIES: Oxygen Saturation is 100% on RA, normal by my interpretation.    COORDINATION OF CARE: 8:15 PM-Discussed treatment plan which includes x-ray with pt at bedside and pt agreed to plan.    Labs (all labs ordered are listed, but only abnormal results are displayed) Labs Reviewed - No data to display  EKG  EKG Interpretation None       Radiology No results found.  Procedures Procedures (including critical care time)  Medications Ordered in ED Medications - No data to display   Initial Impression / Assessment and Plan / ED Course  I have reviewed the triage vital signs and the nursing notes.  Pertinent labs & imaging results that were available during my care of the patient were reviewed by me and considered in my medical decision making (see chart for details).  Clinical Course  Comment By Time  No obvious fracture or dislocation. Small fragment at head of right 1st metacarpal.  Lona Kettle, PA-C 09/18 1740    Patient presents to ED with complaint of right thumb pain. Patient is afebrile and non-toxic appearing in NAD. VSS. No deformity, warmth, erythema, swelling, or TTP of right thumb. ROM intact however with pain. Strength, sensation, and capillary refill intact. No TTP of anatomical snuffbox. Low suspicion for septic joint. X-ray negative for acute fracture or dislocation; incidentally possible old avulsion fracture. Discussed results and plan with patient. Pt advised to follow up with PCP if symptoms persist. Thumb spica, light duty at work, conservative therapy recommended and discussed. Rx vicodin for severe pain. Review of [place] controlled substance database shows last narcotic Rx 07/24/15 hydrocodone-acetaminophen 10 tabs. Pt declines thumb spica at this time.  Return precautions provided. Pt voiced understanding and is agreeable.   Final Clinical Impressions(s) / ED Diagnoses   Final diagnoses:  Thumb pain, right    I personally performed the services described in this documentation, which was scribed in my presence. The recorded information has been reviewed and is accurate.   New Prescriptions Discharge Medication List as of 09/22/2015  8:27 PM       Lona Kettle, PA-C 09/24/15 1849    Lyndal Pulley, MD 09/27/15 [phone]

## 2015-09-22 NOTE — ED Triage Notes (Addendum)
Pt reports to the ED for eval of right thumb injury that he incurred on Saturday morning. He reports he took some of his Percocet 5s from a previous surgery which helped some but he is still having pain. Able to move thumb. No obvious swelling or deformity.

## 2015-09-22 NOTE — Discharge Instructions (Addendum)
Read the information below.   Your x-ray did not show any acute fracture. You appear to have an old fragment in your thumb. I encourage you to rest, ice for 20 minute increments, elevate for the next 24-48 hours. You can take tylenol or motrin for pain relief.  I have prescribed vicodin for severe pain. This can make you drowsy do not drive while taking.  Use the prescribed medication as directed.  Please discuss all new medications with your pharmacist.   It is important to follow up with a primary provider. A resource number is in your paper work to help in establishing a regular doctor. Please call.  You may return to the Emergency Department at any time for worsening condition or any new symptoms that concern you. Return to ED if you develop fever, redness/swelling/warmth, numbness or weakness.

## 2015-11-12 ENCOUNTER — Inpatient Hospital Stay (HOSPITAL_COMMUNITY)
Admission: EM | Admit: 2015-11-12 | Discharge: 2015-11-14 | DRG: 195 | Disposition: A | Discharge status: Home / Self Care | Payer: BLUE CROSS/BLUE SHIELD | Attending: Internal Medicine | Admitting: Internal Medicine

## 2015-11-12 ENCOUNTER — Emergency Department (HOSPITAL_COMMUNITY): Payer: BLUE CROSS/BLUE SHIELD

## 2015-11-12 ENCOUNTER — Encounter (HOSPITAL_COMMUNITY): Payer: Self-pay

## 2015-11-12 DIAGNOSIS — J189 Pneumonia, unspecified organism: Principal | ICD-10-CM | POA: Diagnosis present

## 2015-11-12 DIAGNOSIS — R05 Cough: Secondary | ICD-10-CM | POA: Diagnosis present

## 2015-11-12 DIAGNOSIS — F17210 Nicotine dependence, cigarettes, uncomplicated: Secondary | ICD-10-CM | POA: Diagnosis present

## 2015-11-12 DIAGNOSIS — M542 Cervicalgia: Secondary | ICD-10-CM

## 2015-11-12 DIAGNOSIS — G039 Meningitis, unspecified: Secondary | ICD-10-CM

## 2015-11-12 DIAGNOSIS — R519 Headache, unspecified: Secondary | ICD-10-CM

## 2015-11-12 DIAGNOSIS — J181 Lobar pneumonia, unspecified organism: Secondary | ICD-10-CM

## 2015-11-12 DIAGNOSIS — E860 Dehydration: Secondary | ICD-10-CM

## 2015-11-12 DIAGNOSIS — R059 Cough, unspecified: Secondary | ICD-10-CM | POA: Diagnosis present

## 2015-11-12 DIAGNOSIS — R51 Headache: Secondary | ICD-10-CM

## 2015-11-12 HISTORY — DX: Gastro-esophageal reflux disease without esophagitis: K21.9

## 2015-11-12 LAB — CSF CELL COUNT WITH DIFFERENTIAL
Lymphs, CSF: 5 % — ABNORMAL LOW (ref 40–80)
RBC COUNT CSF: 13 /mm3 — AB
RBC COUNT CSF: 171 /mm3 — AB
Segmented Neutrophils-CSF: 95 % — ABNORMAL HIGH (ref 0–6)
TUBE #: 1
Tube #: 4
WBC CSF: 7 /mm3 — AB (ref 0–5)
WBC, CSF: 11 /mm3 (ref 0–5)

## 2015-11-12 LAB — CBC
HCT: 45.2 % (ref 39.0–52.0)
HEMOGLOBIN: 15.1 g/dL (ref 13.0–17.0)
MCH: 27.8 pg (ref 26.0–34.0)
MCHC: 33.4 g/dL (ref 30.0–36.0)
MCV: 83.2 fL (ref 78.0–100.0)
PLATELETS: 194 10*3/uL (ref 150–400)
RBC: 5.43 MIL/uL (ref 4.22–5.81)
RDW: 14.2 % (ref 11.5–15.5)
WBC: 4.7 10*3/uL (ref 4.0–10.5)

## 2015-11-12 LAB — BASIC METABOLIC PANEL
ANION GAP: 8 (ref 5–15)
BUN: 17 mg/dL (ref 6–20)
CHLORIDE: 105 mmol/L (ref 101–111)
CO2: 26 mmol/L (ref 22–32)
Calcium: 9.3 mg/dL (ref 8.9–10.3)
Creatinine, Ser: 1.13 mg/dL (ref 0.61–1.24)
GFR calc Af Amer: >60 mL/min (ref >60)
GLUCOSE: 86 mg/dL (ref 65–99)
POTASSIUM: 4.1 mmol/L (ref 3.5–5.1)
SODIUM: 139 mmol/L (ref 135–145)

## 2015-11-12 LAB — PROTEIN AND GLUCOSE, CSF
GLUCOSE CSF: 64 mg/dL (ref 40–70)
TOTAL PROTEIN, CSF: 29 mg/dL (ref 15–45)

## 2015-11-12 MED ORDER — NAPROXEN 375 MG PO TABS: 375.0000 mg | ORAL_TABLET | Freq: Two times a day (BID) | ORAL | 0 refills | Status: AC | PRN

## 2015-11-12 MED ORDER — DEXTROSE 5 % IV SOLN
2.0000 g | Freq: Once | INTRAVENOUS | Status: AC
  Administered 2015-11-12: 2 g via INTRAVENOUS
  Filled 2015-11-12: qty 2

## 2015-11-12 MED ORDER — AMOXICILLIN-POT CLAVULANATE 875-125 MG PO TABS: 1.0000 | ORAL_TABLET | Freq: Two times a day (BID) | ORAL | 0 refills | Status: DC

## 2015-11-12 MED ORDER — KETOROLAC TROMETHAMINE 30 MG/ML IJ SOLN
15.0000 mg | Freq: Once | INTRAMUSCULAR | Status: AC
  Administered 2015-11-12: 15 mg via INTRAVENOUS
  Filled 2015-11-12: qty 1

## 2015-11-12 MED ORDER — AZITHROMYCIN 250 MG PO TABS: 250.0000 mg | ORAL_TABLET | Freq: Every day | ORAL | 0 refills | Status: DC

## 2015-11-12 MED ORDER — DEXTROSE 5 % IV SOLN
500.0000 mg | Freq: Once | INTRAVENOUS | Status: AC
  Administered 2015-11-12: 500 mg via INTRAVENOUS
  Filled 2015-11-12: qty 500

## 2015-11-12 MED ORDER — MIDAZOLAM HCL 2 MG/2ML IJ SOLN
2.0000 mg | Freq: Once | INTRAMUSCULAR | Status: AC
  Administered 2015-11-12: 2 mg via INTRAVENOUS
  Filled 2015-11-12: qty 2

## 2015-11-12 MED ORDER — VANCOMYCIN HCL 10 G IV SOLR
2500.0000 mg | Freq: Once | INTRAVENOUS | Status: AC
  Administered 2015-11-12: 2500 mg via INTRAVENOUS
  Filled 2015-11-12: qty 2500

## 2015-11-12 MED ORDER — SODIUM CHLORIDE 0.9 % IV BOLUS (SEPSIS)
1000.0000 mL | Freq: Once | INTRAVENOUS | Status: AC
  Administered 2015-11-12: 1000 mL via INTRAVENOUS

## 2015-11-12 MED ORDER — DEXAMETHASONE SODIUM PHOSPHATE 10 MG/ML IJ SOLN
10.0000 mg | Freq: Once | INTRAMUSCULAR | Status: AC
  Administered 2015-11-12: 10 mg via INTRAVENOUS
  Filled 2015-11-12: qty 1

## 2015-11-12 NOTE — ED Notes (Signed)
Spoke to MD. Pt will be DC.

## 2015-11-12 NOTE — ED Notes (Signed)
Spoke to MD. Pt will be admitted.

## 2015-11-12 NOTE — Discharge Instructions (Signed)
Drink plenty of fluids over the next several days, at least 8 glasses of water/day.  Return to the ED if you develop worsening shortness of breath, fevers, headache, neck stiffness, or other concerning symptoms.   Take ibuprofen and tylenol as needed for headache and pain

## 2015-11-12 NOTE — ED Triage Notes (Signed)
Pt states that he has had a headache since Monday, soreness in his neck and chest congestion, coughing up brown colored phlegm also.

## 2015-11-12 NOTE — ED Notes (Signed)
Consent was taken and witnessed for LP procedure. Procedure was done at the bedside by MD, sterile technique were used. Pt tolerated well. No complications noted

## 2015-11-12 NOTE — ED Provider Notes (Signed)
[facility]PT Provider Note   CSN: [id number] Arrival date & time: 11/12/15  1907     History   Chief Complaint Chief Complaint  Patient presents with  . Headache  . chest congestion    HPI Gary Newton is a 30 y.o. male.  HPI 30 year old presented male who presents with a weeklong history of cough and now a one-day history of headache. The patient states that he has had persistent, productive cough over the last several days. He notes a contacts at work. Over the last 24 hours, he has developed acute worsening of his cough as well as severe headache. He says the headache began gradually and has progressively worsened over the last 24 hours. He states it is now 10 out of 10, throbbing, and starts at the top of his head and radiates down his neck to his lower back. Pain is made worse with flexion of his neck. He denies any fevers or chills and has otherwise been well. Denies any history of neurological issues. Denies any numbness or weakness. He has intermittent blurred vision with standing over the last 24 hours.  History reviewed. No pertinent past medical history.  There are no active problems to display for this patient.   Past Surgical History:  Procedure Laterality Date  . I&D EXTREMITY Left 07/06/2014   Procedure: IRRIGATION AND DEBRIDEMENT MIDDLE FINGER;  Surgeon: Bradly Bienenstock, MD;  Location: [facility] OR;  Service: Orthopedics;  Laterality: Left;    OB History    No data available       Home Medications    Prior to Admission medications   Medication Sig Start Date End Date Taking? Authorizing Provider  pseudoephedrine-acetaminophen (TYLENOL SINUS) 30-500 MG TABS tablet Take 1 tablet by mouth every 4 (four) hours as needed. For sinus symptoms   Yes Historical Provider, MD  cephALEXin (KEFLEX) 500 MG capsule Take 1 capsule (500 mg total) by mouth 4 (four) times daily. Patient not taking: Reported on 11/12/2015 07/04/14   Hayden Rasmussen, NP  doxycycline (VIBRA-TABS)  100 MG tablet Take 1 tablet (100 mg total) by mouth 2 (two) times daily. Patient not taking: Reported on 11/12/2015 07/06/14   Bradly Bienenstock, MD  HYDROcodone-acetaminophen (NORCO/VICODIN) 5-325 MG tablet Take 1 tablet by mouth every 8 (eight) hours as needed. Patient not taking: Reported on 11/12/2015 09/22/15   Lona Kettle, PA-C  ondansetron (ZOFRAN) 4 MG tablet Take 1 tablet (4 mg total) by mouth every 6 (six) hours. Patient not taking: Reported on 11/12/2015 04/21/15   Santiago Glad, PA-C  oxyCODONE-acetaminophen (PERCOCET) 5-325 MG per tablet Take 1 tablet by mouth every 4 (four) hours as needed for moderate pain. Patient not taking: Reported on 11/12/2015 07/06/14   Bradly Bienenstock, MD    Family History No family history on file.  Social History Social History  Substance Use Topics  . Smoking status: Current Every Day Smoker    Packs/day: 0.50    Types: Cigarettes  . Smokeless tobacco: Never Used  . Alcohol use Yes     Comment: occ     Allergies   Patient has no known allergies.   Review of Systems Review of Systems  Constitutional: Positive for chills and fatigue. Negative for fever.  HENT: Negative for congestion and rhinorrhea.   Eyes: Negative for visual disturbance.  Respiratory: Positive for cough. Negative for shortness of breath and wheezing.   Cardiovascular: Negative for chest pain and leg swelling.  Gastrointestinal: Negative for abdominal pain, diarrhea, nausea and vomiting.  Genitourinary: Negative for dysuria and flank pain.  Musculoskeletal: Negative for neck pain and neck stiffness.  Skin: Negative for rash and wound.  Allergic/Immunologic: Negative for immunocompromised state.  Neurological: Positive for light-headedness and headaches. Negative for syncope, weakness and numbness.  All other systems reviewed and are negative.    Physical Exam Updated Vital Signs BP 139/90 (BP Location: Left Arm)   Pulse 81   Temp 98.2 F (36.8 C) (Oral)   Resp 20    Ht 6\' 1"  (1.854 m)   Wt 243 lb (110.2 kg)   SpO2 97%   BMI 32.06 kg/m   Physical Exam  Constitutional: He is oriented to person, place, and time. He appears well-developed and well-nourished. No distress.  HENT:  Head: Normocephalic and atraumatic.  Eyes: Conjunctivae are normal.  Neck: Neck supple.  Mild tenderness with flexion of the neck, with shooting pains down to lower back. No overt rigidity.  Cardiovascular: Normal rate, regular rhythm and normal heart sounds.  Exam reveals no friction rub.   No murmur heard. Pulmonary/Chest: Effort normal. No respiratory distress. He has no wheezes. He has rales (Left mid lung fields).  Abdominal: Soft. Bowel sounds are normal. He exhibits no distension.  Musculoskeletal: He exhibits no edema.  Neurological: He is alert and oriented to person, place, and time. He exhibits normal muscle tone.  Skin: Skin is warm. Capillary refill takes less than 2 seconds.  Psychiatric: He has a normal mood and affect.  Nursing note and vitals reviewed.    ED Treatments / Results  Labs (all labs ordered are listed, but only abnormal results are displayed) Labs Reviewed  CSF CULTURE  GRAM STAIN  CBC  BASIC METABOLIC PANEL  CSF CELL COUNT WITH DIFFERENTIAL  CSF CELL COUNT WITH DIFFERENTIAL  PROTEIN AND GLUCOSE, CSF    EKG  EKG Interpretation None       Radiology Dg Chest 2 View  Result Date: 11/12/2015 CLINICAL DATA:  Chest congestion and productive cough. EXAM: CHEST  2 VIEW COMPARISON:  None. FINDINGS: Heart size is normal. The left upper lobe pneumonia is present. Right lung is clear. The visualized soft tissues and bony thorax are unremarkable. IMPRESSION: Left upper lobe pneumonia. Electronically Signed   By: Marin Robertshristopher  Mattern M.D.   On: 11/12/2015 19:30   Ct Head Wo Contrast  Result Date: 11/12/2015 CLINICAL DATA:  Headache for 3 days, persistent. EXAM: CT HEAD WITHOUT CONTRAST TECHNIQUE: Contiguous axial images were obtained from  the base of the skull through the vertex without intravenous contrast. COMPARISON:  None. FINDINGS: Brain: Normal. No evidence of acute infarction, hemorrhage, hydrocephalus, extra-axial collection or mass lesion/mass effect. Vascular: No hyperdense vessel or unexpected calcification. Skull: Normal. Negative for fracture or focal lesion. Sinuses/Orbits: No acute finding. IMPRESSION: Negative head CT.  No explanation for headache. Electronically Signed   By: Marnee SpringJonathon  Watts M.D.   On: 11/12/2015 21:09    Procedures .Lumbar Puncture Date/Time: 11/12/2015 10:51 PM Performed by: Shaune PollackISAACS, Brysin Towery Authorized by: Shaune PollackISAACS, Aailyah Dunbar   Consent:    Consent obtained:  Verbal   Consent given by:  Patient   Risks discussed:  Bleeding, headache, infection, nerve damage, pain and repeat procedure   Alternatives discussed:  Alternative treatment and observation Pre-procedure details:    Procedure purpose:  Diagnostic   Preparation: Patient was prepped and draped in usual sterile fashion   Sedation:    Sedation type:  Anxiolysis Anesthesia (see MAR for exact dosages):    Anesthesia method:  Local infiltration  Local anesthetic:  Lidocaine 1% w/o epi Procedure details:    Lumbar space:  L4-L5 interspace   Needle gauge:  20   Needle type:  Spinal needle - Quincke tip   Needle length (in):  3.5   Ultrasound guidance: no     Number of attempts:  2   Fluid appearance:  Clear   Tubes of fluid:  4   Total volume (ml):  8 Post-procedure:    Puncture site:  Adhesive bandage applied and direct pressure applied   Patient tolerance of procedure:  Tolerated well, no immediate complications   (including critical care time)  Medications Ordered in ED Medications  sodium chloride 0.9 % bolus 1,000 mL (1,000 mLs Intravenous New Bag/Given 11/12/15 2115)  azithromycin (ZITHROMAX) 500 mg in dextrose 5 % 250 mL IVPB (500 mg Intravenous New Bag/Given 11/12/15 2115)  sodium chloride 0.9 % bolus 1,000 mL (not  administered)  cefTRIAXone (ROCEPHIN) 2 g in dextrose 5 % 50 mL IVPB (2 g Intravenous New Bag/Given 11/12/15 2115)  ketorolac (TORADOL) 30 MG/ML injection 15 mg (15 mg Intravenous Given 11/12/15 2114)  midazolam (VERSED) injection 2 mg (2 mg Intravenous Given 11/12/15 2118)     Initial Impression / Assessment and Plan / ED Course  I have reviewed the triage vital signs and the nursing notes.  Pertinent labs & imaging results that were available during my care of the patient were reviewed by me and considered in my medical decision making (see chart for details).  Clinical Course as of Nov 11 2152  Wed Nov 12, 2015  1951 DG Chest 2 View [CI]    Clinical Course User Index [CI] Shaune Pollackameron Desarae Placide, MD    30 year old male with no significant past medical history presents with a weeklong history of cough followed by a long history of severe headache and neck pain/stiffness. On arrival, patient is afebrile and nontoxic. Exam is as above. Chest x-ray obtained in triage shows focal left sided pneumonia. At this time, my primary suspicion is community acquired pneumonia with subsequent dehydration and headache. However, the patient is also c/o neck stiffness as well as radiation down to his lower back. I discussed possibility of superimposed meningitis in the setting of possible weeklong pneumonia. After informed consent and shared decision-making, decision made to pursue lumbar puncture. Patient tolerated procedure well as above. Labwork otherwise unremarkable. Patient has been given Rocephin, azithromycin, and is tolerating by mouth here in the ED.  CSF results returned and are remarkable for 7 WBCs in tube 4, 11 WBCs in tube 1. Minimal RBCs. Negative gram stain with mononuclear predominanceAnd 95% neutrophils. I discussed results with infectious disease physician, Dr. Orvan Falconerampbell, on call. Given neutrophil predominance and pneumonia on chest x-ray, there is some concern for strep pneumonia with possible  meningitic seeding. Will admit to follow up cultures based on recommendation. First dose of vanc/rocephin/azithro given here, along with decadron IV. He recommends otherwise treating for CAP with further ABX and following up clinical course and disposition. Of note, given well-appearance, BCx initially held but in setting of questionable meningitis, will send BCx.  Final Clinical Impressions(s) / ED Diagnoses   Final diagnoses:  Community acquired pneumonia of left upper lobe of lung (HCC)  Dehydration  Acute nonintractable headache, unspecified headache type  Neck pain  Meningitis      Shaune Pollackameron Vinson Tietze, MD 11/12/15 2325

## 2015-11-13 ENCOUNTER — Encounter (HOSPITAL_COMMUNITY): Payer: Self-pay | Admitting: Internal Medicine

## 2015-11-13 DIAGNOSIS — F17210 Nicotine dependence, cigarettes, uncomplicated: Secondary | ICD-10-CM | POA: Diagnosis present

## 2015-11-13 DIAGNOSIS — J181 Lobar pneumonia, unspecified organism: Secondary | ICD-10-CM | POA: Diagnosis not present

## 2015-11-13 DIAGNOSIS — J189 Pneumonia, unspecified organism: Secondary | ICD-10-CM | POA: Diagnosis present

## 2015-11-13 DIAGNOSIS — E860 Dehydration: Secondary | ICD-10-CM | POA: Diagnosis present

## 2015-11-13 DIAGNOSIS — R05 Cough: Secondary | ICD-10-CM | POA: Diagnosis not present

## 2015-11-13 DIAGNOSIS — R059 Cough, unspecified: Secondary | ICD-10-CM | POA: Diagnosis present

## 2015-11-13 LAB — BASIC METABOLIC PANEL
ANION GAP: 7 (ref 5–15)
BUN: 15 mg/dL (ref 6–20)
CHLORIDE: 105 mmol/L (ref 101–111)
CO2: 24 mmol/L (ref 22–32)
Calcium: 8.8 mg/dL — ABNORMAL LOW (ref 8.9–10.3)
Creatinine, Ser: 1.13 mg/dL (ref 0.61–1.24)
Glucose, Bld: 137 mg/dL — ABNORMAL HIGH (ref 65–99)
POTASSIUM: 4.4 mmol/L (ref 3.5–5.1)
SODIUM: 136 mmol/L (ref 135–145)

## 2015-11-13 LAB — CBC WITH DIFFERENTIAL/PLATELET
BASOS ABS: 0 10*3/uL (ref 0.0–0.1)
Basophils Relative: 0 %
EOS ABS: 0 10*3/uL (ref 0.0–0.7)
Eosinophils Relative: 0 %
HCT: 46.4 % (ref 39.0–52.0)
HEMOGLOBIN: 15.3 g/dL (ref 13.0–17.0)
LYMPHS ABS: 0.9 10*3/uL (ref 0.7–4.0)
Lymphocytes Relative: 17 %
MCH: 27.6 pg (ref 26.0–34.0)
MCHC: 33 g/dL (ref 30.0–36.0)
MCV: 83.6 fL (ref 78.0–100.0)
Monocytes Absolute: 0.2 10*3/uL (ref 0.1–1.0)
Monocytes Relative: 5 %
NEUTROS PCT: 78 %
Neutro Abs: 4 10*3/uL (ref 1.7–7.7)
PLATELETS: 203 10*3/uL (ref 150–400)
RBC: 5.55 MIL/uL (ref 4.22–5.81)
RDW: 14.4 % (ref 11.5–15.5)
WBC: 5.1 10*3/uL (ref 4.0–10.5)

## 2015-11-13 LAB — EXPECTORATED SPUTUM ASSESSMENT W GRAM STAIN, RFLX TO RESP C

## 2015-11-13 LAB — HIV ANTIBODY (ROUTINE TESTING W REFLEX): HIV SCREEN 4TH GENERATION: NONREACTIVE

## 2015-11-13 LAB — PROCALCITONIN

## 2015-11-13 LAB — EXPECTORATED SPUTUM ASSESSMENT W REFEX TO RESP CULTURE

## 2015-11-13 LAB — STREP PNEUMONIAE URINARY ANTIGEN: Strep Pneumo Urinary Antigen: NEGATIVE

## 2015-11-13 MED ORDER — ACETAMINOPHEN 325 MG PO TABS: 650.0000 mg | ORAL_TABLET | Freq: Four times a day (QID) | ORAL | Status: DC | PRN

## 2015-11-13 MED ORDER — SALINE SPRAY 0.65 % NA SOLN
1.0000 | NASAL | Status: DC | PRN
  Filled 2015-11-13: qty 44

## 2015-11-13 MED ORDER — SODIUM CHLORIDE 0.9 % IV SOLN
INTRAVENOUS | Status: AC
  Administered 2015-11-13: 03:00:00 via INTRAVENOUS

## 2015-11-13 MED ORDER — OXYCODONE-ACETAMINOPHEN 5-325 MG PO TABS
1.0000 | ORAL_TABLET | ORAL | Status: DC | PRN
  Administered 2015-11-13 – 2015-11-14 (×3): 1 via ORAL
  Filled 2015-11-13 (×3): qty 1

## 2015-11-13 MED ORDER — BUTALBITAL-APAP-CAFFEINE 50-325-40 MG PO TABS
1.0000 | ORAL_TABLET | ORAL | Status: DC | PRN
  Administered 2015-11-13: 1 via ORAL
  Filled 2015-11-13: qty 1

## 2015-11-13 MED ORDER — DIPHENHYDRAMINE HCL 25 MG PO CAPS
25.0000 mg | ORAL_CAPSULE | Freq: Four times a day (QID) | ORAL | Status: DC | PRN
  Administered 2015-11-13: 25 mg via ORAL
  Filled 2015-11-13: qty 1

## 2015-11-13 MED ORDER — DEXTROSE 5 % IV SOLN
1.0000 g | INTRAVENOUS | Status: DC
  Administered 2015-11-13: 1 g via INTRAVENOUS
  Filled 2015-11-13 (×2): qty 10

## 2015-11-13 MED ORDER — ENOXAPARIN SODIUM 60 MG/0.6ML ~~LOC~~ SOLN
55.0000 mg | SUBCUTANEOUS | Status: DC
Start: 2015-11-13 — End: 2015-11-14
  Administered 2015-11-13: 55 mg via SUBCUTANEOUS
  Filled 2015-11-13 (×2): qty 0.55

## 2015-11-13 MED ORDER — DEXTROSE 5 % IV SOLN
500.0000 mg | INTRAVENOUS | Status: DC
  Administered 2015-11-13: 500 mg via INTRAVENOUS
  Filled 2015-11-13 (×2): qty 500

## 2015-11-13 MED ORDER — ONDANSETRON HCL 4 MG PO TABS: 4.0000 mg | ORAL_TABLET | Freq: Four times a day (QID) | ORAL | Status: DC | PRN

## 2015-11-13 MED ORDER — FLUTICASONE PROPIONATE 50 MCG/ACT NA SUSP
2.0000 | Freq: Every day | NASAL | Status: DC
  Administered 2015-11-13 – 2015-11-14 (×2): 2 via NASAL
  Filled 2015-11-13: qty 16

## 2015-11-13 MED ORDER — ENOXAPARIN SODIUM 40 MG/0.4ML ~~LOC~~ SOLN: 40.0000 mg | SUBCUTANEOUS | Status: DC

## 2015-11-13 NOTE — Progress Notes (Signed)
Consulting MD paged via Loretha StaplerAMION

## 2015-11-13 NOTE — Care Management Note (Signed)
Case Management Note  Patient Details  Name: Gary KochJermaine E Ossa MRN: [id number] Date of Birth: [birth year]  Subjective/Objective:     Pt in with a cough and on IV antibiotics. He is from home.               Action/Plan: Plan is to discharge home when medically stable. CM following for d/c needs.   Expected Discharge Date:   (Pending)               Expected Discharge Plan:  Home/Self Care  In-House Referral:     Discharge planning Services     Post Acute Care Choice:    Choice offered to:     DME Arranged:    DME Agency:     HH Arranged:    HH Agency:     Status of Service:  In process, will continue to follow  If discussed at Long Length of Stay Meetings, dates discussed:    Additional Comments:  Kermit BaloKelli F Laurrie Toppin, RN 11/13/2015, 11:11 AM

## 2015-11-13 NOTE — H&P (Signed)
TRH H&P   Patient Demographics:    Gary Newton, is a 30 y.o. male  MRN: [id number]   DOB - [birth year]  Admit Date - 11/12/2015  Outpatient Primary MD for the patient is No PCP Per Patient  Referring MD/NP/PA:  Leta Jungling  Outpatient Specialists:   Patient coming from: work  Chief Complaint  Patient presents with  . Headache  . chest congestion      HPI:    Gary Newton  is a 30 y.o. male, w c/o slight headache, and diarrhea 4 days ago.  Pt states that started to have cough.  3 days ago.  + brown phlegm.  Subjective fever at home.  Denies cp, palp, sob, n/v, photophobia.     Pt presented to ED due to headache, cough today. In ED,  His CXR showed left upper lung infilitrate.  Pt due to headache had LP which showed 11 wbc, 95% neutrophils,  Per ED, contacted ID who recommended admission and coverage of cap.     Review of systems:    In addition to the HPI above,    No changes with Vision or hearing, No problems swallowing food or Liquids, No Chest pain, Cough or Shortness of Breath, No Abdominal pain, No Nausea or Vommitting, Bowel movements are regular, No Blood in stool or Urine, No dysuria, No new skin rashes or bruises, No new joints pains-aches,  No new weakness, tingling, numbness in any extremity, No recent weight gain or loss, No polyuria, polydypsia or polyphagia, No significant Mental Stressors.  A full 10 point Review of Systems was done, except as stated above, all other Review of Systems were negative.   With Past History of the following :    Past Medical History:  Diagnosis Date  . GERD (gastroesophageal reflux disease)       Past Surgical History:  Procedure Laterality Date  . I&D EXTREMITY Left 07/06/2014   Procedure: IRRIGATION AND DEBRIDEMENT MIDDLE FINGER;  Surgeon: Bradly Bienenstock, MD;  Location: [facility] OR;  Service: Orthopedics;   Laterality: Left;      Social History:     Social History  Substance Use Topics  . Smoking status: Current Every Day Smoker    Packs/day: 0.50    Years: 10.00    Types: Cigarettes  . Smokeless tobacco: Never Used  . Alcohol use Yes     Comment: occ     Lives - at home  Mobility - ambulates alone   Family History :     Family History  Problem Relation Age of Onset  . Bell's palsy Mother   . Prostate cancer Father       Home Medications:   Prior to Admission medications   Medication Sig Start Date End Date Taking? Authorizing Provider  pseudoephedrine-acetaminophen (TYLENOL SINUS) 30-500 MG TABS tablet Take 1 tablet by mouth every 4 (four) hours as needed. For  sinus symptoms   Yes Historical Provider, MD  amoxicillin-clavulanate (AUGMENTIN) 875-125 MG tablet Take 1 tablet by mouth every 12 (twelve) hours. 11/12/15 11/19/15  Shaune Pollackameron Isaacs, MD  azithromycin (ZITHROMAX) 250 MG tablet Take 1 tablet (250 mg total) by mouth daily. Take first 2 tablets together, then 1 every day until finished. 11/12/15   Shaune Pollackameron Isaacs, MD  cephALEXin (KEFLEX) 500 MG capsule Take 1 capsule (500 mg total) by mouth 4 (four) times daily. Patient not taking: Reported on 11/12/2015 07/04/14   Hayden Rasmussenavid Mabe, NP  doxycycline (VIBRA-TABS) 100 MG tablet Take 1 tablet (100 mg total) by mouth 2 (two) times daily. Patient not taking: Reported on 11/12/2015 07/06/14   Bradly BienenstockFred Ortmann, MD  HYDROcodone-acetaminophen (NORCO/VICODIN) 5-325 MG tablet Take 1 tablet by mouth every 8 (eight) hours as needed. Patient not taking: Reported on 11/12/2015 09/22/15   Lona KettleAshley Laurel Meyer, PA-C  naproxen (NAPROSYN) 375 MG tablet Take 1 tablet (375 mg total) by mouth 2 (two) times daily as needed for moderate pain. 11/12/15 11/19/15  Shaune Pollackameron Isaacs, MD  ondansetron (ZOFRAN) 4 MG tablet Take 1 tablet (4 mg total) by mouth every 6 (six) hours. Patient not taking: Reported on 11/12/2015 04/21/15   Santiago GladHeather Laisure, PA-C    oxyCODONE-acetaminophen (PERCOCET) 5-325 MG per tablet Take 1 tablet by mouth every 4 (four) hours as needed for moderate pain. Patient not taking: Reported on 11/12/2015 07/06/14   Bradly BienenstockFred Ortmann, MD     Allergies:    No Known Allergies   Physical Exam:   Vitals  Blood pressure 125/80, pulse 72, temperature 98.2 F (36.8 C), temperature source Oral, resp. rate 20, height 6\' 1"  (1.854 m), weight 110.2 kg (243 lb), SpO2 98 %.   1. General  lying in bed in NAD,    2. Normal affect and insight, Not Suicidal or Homicidal, Awake Alert, Oriented X 3.  3. No F.N deficits, ALL C.Nerves Intact, Strength 5/5 all 4 extremities, Sensation intact all 4 extremities, Plantars down going.  4. Ears and Eyes appear Normal, Conjunctivae clear, PERRLA. Moist Oral Mucosa.  5. Supple Neck, No JVD, No cervical lymphadenopathy appriciated, No Carotid Bruits.  6. Symmetrical Chest wall movement, Good air movement bilaterally, CTAB.  7. RRR, No Gallops, Rubs or Murmurs, No Parasternal Heave.  8. Positive Bowel Sounds, Abdomen Soft, No tenderness, No organomegaly appriciated,No rebound -guarding or rigidity.  9.  No Cyanosis, Normal Skin Turgor, No Skin Rash or Bruise.  10. Good muscle tone,  joints appear normal , no effusions, Normal ROM.  11. No Palpable Lymph Nodes in Neck or Axillae  Negative kernig, negative brudzinski Negative photophobia.     Data Review:    CBC  Recent Labs Lab 11/12/15 2055  WBC 4.7  HGB 15.1  HCT 45.2  PLT 194  MCV 83.2  MCH 27.8  MCHC 33.4  RDW 14.2   ------------------------------------------------------------------------------------------------------------------  Chemistries   Recent Labs Lab 11/12/15 2055  NA 139  K 4.1  CL 105  CO2 26  GLUCOSE 86  BUN 17  CREATININE 1.13  CALCIUM 9.3   ------------------------------------------------------------------------------------------------------------------ estimated creatinine clearance is 102.6  mL/min for male patients and 124.4 mL/min for male patients (by C-G formula based on SCr of 1.13 mg/dL). ------------------------------------------------------------------------------------------------------------------ No results for input(s): TSH, T4TOTAL, T3FREE, THYROIDAB in the last 72 hours.  Invalid input(s): FREET3  Coagulation profile No results for input(s): INR, PROTIME in the last 168 hours. ------------------------------------------------------------------------------------------------------------------- No results for input(s): DDIMER in the last 72 hours. -------------------------------------------------------------------------------------------------------------------  Cardiac Enzymes  No results for input(s): CKMB, TROPONINI, MYOGLOBIN in the last 168 hours.  Invalid input(s): CK ------------------------------------------------------------------------------------------------------------------ No results found for: BNP   ---------------------------------------------------------------------------------------------------------------  Urinalysis    Component Value Date/Time   LABSPEC 1.025 07/24/2015 1618   PHURINE 6.0 07/24/2015 1618   GLUCOSEU NEGATIVE 07/24/2015 1618   HGBUR NEGATIVE 07/24/2015 1618   BILIRUBINUR NEGATIVE 07/24/2015 1618   KETONESUR NEGATIVE 07/24/2015 1618   PROTEINUR NEGATIVE 07/24/2015 1618   UROBILINOGEN 0.2 07/24/2015 1618   NITRITE NEGATIVE 07/24/2015 1618   LEUKOCYTESUR NEGATIVE 07/24/2015 1618    ----------------------------------------------------------------------------------------------------------------   Imaging Results:    Dg Chest 2 View  Result Date: 11/12/2015 CLINICAL DATA:  Chest congestion and productive cough. EXAM: CHEST  2 VIEW COMPARISON:  None. FINDINGS: Heart size is normal. The left upper lobe pneumonia is present. Right lung is clear. The visualized soft tissues and bony thorax are unremarkable. IMPRESSION: Left  upper lobe pneumonia. Electronically Signed   By: Marin Robertshristopher  Mattern M.D.   On: 11/12/2015 19:30   Ct Head Wo Contrast  Result Date: 11/12/2015 CLINICAL DATA:  Headache for 3 days, persistent. EXAM: CT HEAD WITHOUT CONTRAST TECHNIQUE: Contiguous axial images were obtained from the base of the skull through the vertex without intravenous contrast. COMPARISON:  None. FINDINGS: Brain: Normal. No evidence of acute infarction, hemorrhage, hydrocephalus, extra-axial collection or mass lesion/mass effect. Vascular: No hyperdense vessel or unexpected calcification. Skull: Normal. Negative for fracture or focal lesion. Sinuses/Orbits: No acute finding. IMPRESSION: Negative head CT.  No explanation for headache. Electronically Signed   By: Marnee SpringJonathon  Watts M.D.   On: 11/12/2015 21:09      Assessment & Plan:    Active Problems:   Meningitis   Cough    1. Cough, CAP Check blood culture x2,  Check sputum culture Check urine legionella antigen Rocephin, zithromax iv  2. Headache,  Appreciate ID input.  Rocephin iv,  No vanco or ampicillin needed per ID per ED.    DVT Prophylaxis   Lovenox - SCDs   AM Labs Ordered, also please review Full Orders  Family Communication: Admission, patients condition and plan of care including tests being ordered have been discussed with the patient  who indicate understanding and agree with the plan and Code Status.  Code Status FULL CODE  Likely DC to  home  Condition GUARDED    Consults called:  ID consulted by ED.   Admission status: observation  Time spent in minutes : 45 minutes   Pearson GrippeJames Prisha Hiley M.D on 11/13/2015 at 12:16 AM  Between 7am to 7pm - Pager - [phone] After 7pm go to [email or web address] - password [email or web address]  [facility] - Office  [phone]

## 2015-11-13 NOTE — ED Notes (Signed)
Pt presented with cough X1 week and HA X1 day. Dx with pneumonia, given zithromax, rocephin, vancomycin. Admitting wanted LP performed, dx with meningitis, likely viral in nature. Given fluids, toradol, decadron. Pt A/OX4, low fall risk, ambulatory, VSS.

## 2015-11-13 NOTE — Progress Notes (Signed)
[facility] PLAN OF CARE NOTE Patient: Gary KochJermaine E Newton JYN:[id number]   PCP: No PCP Per Patient DOB: 1985/12/09   DOA: 11/12/2015   DOS: 11/13/2015    Patient was admitted by my colleague Dr. Selena BattenKim earlier on 11/13/2015. I have reviewed the H&P as well as assessment and plan and agree with the same. Important changes in the plan are listed below.  Plan of care:   Meningitis   CAP (community acquired pneumonia) Continue current management   Author: Lynden OxfordPranav Yudith Norlander, MD [facility] Pager: [phone] 11/13/2015 7:42 PM   If 7PM-7AM, please contact night-coverage at [email or web address], password [facility]

## 2015-11-13 NOTE — Progress Notes (Signed)
Patient c/o of itching on head upon admission to unit. Patient has raised rash anterior and posterior trunk, now c/o generalized itching. Patient has no SOB, N/V. [facility] paged. RN will continue to monitor.

## 2015-11-14 LAB — LEGIONELLA PNEUMOPHILA SEROGP 1 UR AG: L. PNEUMOPHILA SEROGP 1 UR AG: NEGATIVE

## 2015-11-14 LAB — CBC WITH DIFFERENTIAL/PLATELET
BASOS ABS: 0 10*3/uL (ref 0.0–0.1)
BASOS PCT: 0 %
EOS PCT: 2 %
Eosinophils Absolute: 0.1 10*3/uL (ref 0.0–0.7)
HCT: 42.4 % (ref 39.0–52.0)
Hemoglobin: 13.7 g/dL (ref 13.0–17.0)
Lymphocytes Relative: 38 %
Lymphs Abs: 2.8 10*3/uL (ref 0.7–4.0)
MCH: 27 pg (ref 26.0–34.0)
MCHC: 32.3 g/dL (ref 30.0–36.0)
MCV: 83.5 fL (ref 78.0–100.0)
MONO ABS: 1.2 10*3/uL — AB (ref 0.1–1.0)
Monocytes Relative: 17 %
Neutro Abs: 3.2 10*3/uL (ref 1.7–7.7)
Neutrophils Relative %: 43 %
PLATELETS: 218 10*3/uL (ref 150–400)
RBC: 5.08 MIL/uL (ref 4.22–5.81)
RDW: 14.3 % (ref 11.5–15.5)
WBC: 7.4 10*3/uL (ref 4.0–10.5)

## 2015-11-14 LAB — COMPREHENSIVE METABOLIC PANEL
ALBUMIN: 3.1 g/dL — AB (ref 3.5–5.0)
ALT: 20 U/L (ref 17–63)
AST: 16 U/L (ref 15–41)
Alkaline Phosphatase: 36 U/L — ABNORMAL LOW (ref 38–126)
Anion gap: 6 (ref 5–15)
BUN: 14 mg/dL (ref 6–20)
CHLORIDE: 107 mmol/L (ref 101–111)
CO2: 26 mmol/L (ref 22–32)
Calcium: 8.6 mg/dL — ABNORMAL LOW (ref 8.9–10.3)
Creatinine, Ser: 1.22 mg/dL (ref 0.61–1.24)
GFR calc Af Amer: >60 mL/min (ref >60)
GFR calc non Af Amer: >60 mL/min (ref >60)
GLUCOSE: 108 mg/dL — AB (ref 65–99)
POTASSIUM: 3.8 mmol/L (ref 3.5–5.1)
SODIUM: 139 mmol/L (ref 135–145)
Total Bilirubin: <0.1 mg/dL — ABNORMAL LOW (ref 0.3–1.2)
Total Protein: 6 g/dL — ABNORMAL LOW (ref 6.5–8.1)

## 2015-11-14 LAB — HSV DNA BY PCR (REFERENCE LAB)
HSV 1 DNA: NEGATIVE
HSV 2 DNA: NEGATIVE

## 2015-11-14 LAB — PATHOLOGIST SMEAR REVIEW

## 2015-11-14 MED ORDER — LEVOFLOXACIN 750 MG PO TABS: 750.0000 mg | ORAL_TABLET | Freq: Every day | ORAL | 0 refills | Status: AC

## 2015-11-14 MED ORDER — SALINE SPRAY 0.65 % NA SOLN: 1.0000 | NASAL | 0 refills | Status: AC | PRN

## 2015-11-14 MED ORDER — AZITHROMYCIN 500 MG PO TABS
500.0000 mg | ORAL_TABLET | ORAL | Status: DC
  Filled 2015-11-14: qty 1

## 2015-11-14 MED ORDER — BUTALBITAL-APAP-CAFFEINE 50-325-40 MG PO TABS: 1.0000 | ORAL_TABLET | ORAL | 0 refills | Status: AC | PRN

## 2015-11-14 NOTE — Progress Notes (Signed)
Patient is discharged from room 5M08 at this time. Alert and in stable condition. IV site d/c'd and instructions given to patient with understanding verbalized. Left unit via wheelchair with all belongings at side.

## 2015-11-14 NOTE — Care Management Note (Signed)
Case Management Note  Patient Details  Name: Gary KochJermaine E Newton MRN: [id number] Date of Birth: Nov 12, 1985  Subjective/Objective:                    Action/Plan: Pt discharging home with his spouse. Pt does not have a PCP. Dr Allena KatzPatel placed information on AVS regarding [facility]. CM [facility] him with [facility] and also gave him the [facility] number to assist him in finding a PCP.   Expected Discharge Date:   (Pending)               Expected Discharge Plan:  Home/Self Care  In-House Referral:     Discharge planning Services     Post Acute Care Choice:    Choice offered to:     DME Arranged:    DME Agency:     HH Arranged:    HH Agency:     Status of Service:  Completed, signed off  If discussed at [employer] Length of Stay Meetings, dates discussed:    Additional Comments:  Kermit BaloKelli F Capria Cartaya, RN 11/14/2015, 1:50 PM

## 2015-11-16 LAB — CULTURE, RESPIRATORY: CULTURE: NORMAL

## 2015-11-16 LAB — CSF CULTURE W GRAM STAIN
Culture: NO GROWTH
Special Requests: NORMAL

## 2015-11-17 NOTE — Discharge Summary (Signed)
[facility] Discharge Summary   Patient: Gary KochJermaine E Newton ZOX:[id number]   PCP: No PCP Per Patient DOB: 05/03/1985   Date of admission: 11/12/2015   Date of discharge: 11/14/2015    Discharge Diagnoses:  Principal Problem:   Cough Active Problems:   Meningitis   CAP (community acquired pneumonia)  Admitted From: home Disposition:  home  Recommendations for Outpatient Follow-up:  1. Please follow up with PCP.   Follow-up Information    Schedule an appointment as soon as possible for a visit with [facility].   Why:  Follow-up with a primary doctor in 3-5 days as needed. If you do not have a doctor, you can call this number to set up an appointment with one. Contact information: [address] [place] [phone]       [facility] Follow up.   Specialty:  Emergency Medicine Why:  As needed, If symptoms worsen Contact information: [address] [id number] [place] [id number] [phone]         Diet recommendation: cardiac diet  Activity: The patient is advised to gradually reintroduce usual activities.  Discharge Condition: good  Code Status: full code  History of present illness: As per the H and P dictated on admission, "Gary ScottsJermaine Birkeland  is a 30 y.o. male, w c/o slight headache, and diarrhea 4 days ago.  Pt states that started to have cough.  3 days ago.  + brown phlegm. Subjective fever at home.  Denies cp, palp, sob, n/v, photophobia.  Pt presented to ED due to headache, cough today. In ED,  His CXR showed left upper lung infilitrate.  Pt due to headache had LP which showed 11 wbc, 95% neutrophils,  Per ED, contacted ID who recommended admission and coverage of cap. "  Hospital Course:  Summary of his active problems in the hospital is as following. 1. CAP Negative blood culture x2,  Negative sputum culture Negative legionella  antigen Was on Rocephin, zithromax iv. Change to levofloxacin to finish 7 days treatment course  2. Headache, resolved Appreciate ID input.  No meningitis coverage needed per ID.  All other chronic medical condition were stable during the hospitalization.  Patient was ambulatory without any assistance. On the day of the discharge the patient's vitals , and no other acute medical condition were reported by patient. the patient was felt safe to be discharge at home with family.  Procedures and Results:  Lumber puncture   Consultations:  Phone consult with infectious disease  DISCHARGE MEDICATION: Discharge Medication List as of 11/14/2015  4:28 PM    START taking these medications   Details  butalbital-acetaminophen-caffeine (FIORICET, ESGIC) 50-325-40 MG tablet Take 1 tablet by mouth every 4 (four) hours as needed for headache., Starting Fri 11/14/2015, Print    levofloxacin (LEVAQUIN) 750 MG tablet Take 1 tablet (750 mg total) by mouth daily., Starting Fri 11/14/2015, Until Thu 11/20/2015, Normal    naproxen (NAPROSYN) 375 MG tablet Take 1 tablet (375 mg total) by mouth 2 (two) times daily as needed for moderate pain., Starting Wed 11/12/2015, Until Wed 11/19/2015, Print    sodium chloride (OCEAN) 0.65 % SOLN nasal spray Place 1 spray into both nostrils as needed for congestion., Starting Fri 11/14/2015, Normal      CONTINUE these medications which have NOT CHANGED   Details  HYDROcodone-acetaminophen (NORCO/VICODIN) 5-325 MG tablet Take 1 tablet by mouth every 8 (eight) hours as needed., Starting Mon  09/22/2015, Print    ondansetron (ZOFRAN) 4 MG tablet Take 1 tablet (4 mg total) by mouth every 6 (six) hours., Starting Mon 04/21/2015, Print    oxyCODONE-acetaminophen (PERCOCET) 5-325 MG per tablet Take 1 tablet by mouth every 4 (four) hours as needed for moderate pain., Starting Sat 07/06/2014, Print      STOP taking these medications     cephALEXin (KEFLEX) 500 MG capsule       doxycycline (VIBRA-TABS) 100 MG tablet      pseudoephedrine-acetaminophen (TYLENOL SINUS) 30-500 MG TABS tablet        Allergies  Allergen Reactions  . Vancomycin Hives   Discharge Instructions    Diet general    Complete by:  As directed    Discharge instructions    Complete by:  As directed    It is important that you read following instructions as well as go over your medication list with RN to help you understand your care after this hospitalization.  Discharge Instructions:  Please follow-up with PCP in one week  Please request your primary care physician to go over all Hospital Tests and Procedure/Radiological results at the follow up,  Please get all Hospital records sent to your PCP by signing hospital release before you go home.   Do not take more than prescribed Pain, Sleep and Anxiety Medications. You were cared for by a hospitalist during your hospital stay. If you have any questions about your discharge medications or the care you received while you were in the hospital after you are discharged, you can call the unit and ask to speak with the hospitalist on call if the hospitalist that took care of you is not available.  Once you are discharged, your primary care physician will handle any further medical issues. Please note that NO REFILLS for any discharge medications will be authorized once you are discharged, as it is imperative that you return to your primary care physician (or establish a relationship with a primary care physician if you do not have one) for your aftercare needs so that they can reassess your need for medications and monitor your lab values. You Must read complete instructions/literature along with all the possible adverse reactions/side effects for all the Medicines you take and that have been prescribed to you. Take any new Medicines after you have completely understood and accept all the possible adverse reactions/side effects. Wear Seat belts  while driving. If you have smoked or chewed Tobacco in the last 2 yrs please stop smoking and/or stop any Recreational drug use.   Increase activity slowly    Complete by:  As directed      Discharge Exam: Filed Weights   11/12/15 1913 11/13/15 0113  Weight: 110.2 kg (243 lb) 112.5 kg (248 lb 0.3 oz)   Vitals:   11/14/15 0620 11/14/15 1123  BP: 117/66 112/60  Pulse: 63 83  Resp: 18 18  Temp: 97.6 F (36.4 C) 98 F (36.7 C)   General: Appear in no distress, no Rash; Oral Mucosa moist. Cardiovascular: S1 and S2 Present, no Murmur, no JVD Respiratory: Bilateral Air entry present and Clear to Auscultation, no Crackles, no wheezes Abdomen: Bowel Sound present, Soft and no tenderness Extremities: no Pedal edema, no calf tenderness Neurology: Grossly no focal neuro deficit.  The results of significant diagnostics from this hospitalization (including imaging, microbiology, ancillary and laboratory) are listed below for reference.    Significant Diagnostic Studies: Dg Chest 2 View  Result Date: 11/12/2015 CLINICAL DATA:  Chest congestion and productive cough. EXAM: CHEST  2 VIEW COMPARISON:  None. FINDINGS: Heart size is normal. The left upper lobe pneumonia is present. Right lung is clear. The visualized soft tissues and bony thorax are unremarkable. IMPRESSION: Left upper lobe pneumonia. Electronically Signed   By: Marin Roberts M.D.   On: 11/12/2015 19:30   Ct Head Wo Contrast  Result Date: 11/12/2015 CLINICAL DATA:  Headache for 3 days, persistent. EXAM: CT HEAD WITHOUT CONTRAST TECHNIQUE: Contiguous axial images were obtained from the base of the skull through the vertex without intravenous contrast. COMPARISON:  None. FINDINGS: Brain: Normal. No evidence of acute infarction, hemorrhage, hydrocephalus, extra-axial collection or mass lesion/mass effect. Vascular: No hyperdense vessel or unexpected calcification. Skull: Normal. Negative for fracture or focal lesion.  Sinuses/Orbits: No acute finding. IMPRESSION: Negative head CT.  No explanation for headache. Electronically Signed   By: Marnee Spring M.D.   On: 11/12/2015 21:09    Microbiology: Recent Results (from the past 240 hour(s))  CSF culture     Status: None   Collection Time: 11/12/15  9:40 PM  Result Value Ref Range Status   Specimen Description CSF  Final   Special Requests Normal  Final   Gram Stain   Final    WBC PRESENT, PREDOMINANTLY MONONUCLEAR NO ORGANISMS SEEN CYTOSPIN    Culture NO GROWTH 3 DAYS  Final   Report Status 11/16/2015 FINAL  Final  Blood culture (routine x 2)     Status: None (Preliminary result)   Collection Time: 11/12/15 11:19 PM  Result Value Ref Range Status   Specimen Description BLOOD RIGHT ARM  Final   Special Requests BOTTLES DRAWN AEROBIC AND ANAEROBIC  Final   Culture NO GROWTH 3 DAYS  Final   Report Status PENDING  Incomplete  Blood culture (routine x 2)     Status: None (Preliminary result)   Collection Time: 11/12/15 11:41 PM  Result Value Ref Range Status   Specimen Description BLOOD LEFT ARM  Final   Special Requests BOTTLES DRAWN AEROBIC AND ANAEROBIC  Final   Culture NO GROWTH 3 DAYS  Final   Report Status PENDING  Incomplete  Culture, sputum-assessment     Status: None   Collection Time: 11/13/15  3:44 PM  Result Value Ref Range Status   Specimen Description EXPECTORATED SPUTUM  Final   Special Requests NONE  Final   Sputum evaluation   Final    THIS SPECIMEN IS ACCEPTABLE. RESPIRATORY CULTURE REPORT TO FOLLOW.   Report Status 11/13/2015 FINAL  Final  Culture, respiratory (NON-Expectorated)     Status: None   Collection Time: 11/13/15  3:44 PM  Result Value Ref Range Status   Specimen Description EXPECTORATED SPUTUM  Final   Special Requests NONE  Final   Gram Stain   Final    ABUNDANT WBC PRESENT, PREDOMINANTLY MONONUCLEAR FEW GRAM POSITIVE COCCI IN PAIRS FEW GRAM NEGATIVE RODS RARE GRAM POSITIVE RODS RARE GRAM VARIABLE  ROD    Culture Consistent with normal respiratory flora.  Final   Report Status 11/16/2015 FINAL  Final     Labs: CBC:  Recent Labs Lab 11/12/15 2055 11/13/15 0850 11/14/15 0455  WBC 4.7 5.1 7.4  NEUTROABS  --  4.0 3.2  HGB 15.1 15.3 13.7  HCT 45.2 46.4 42.4  MCV 83.2 83.6 83.5  PLT 194 203 218   Basic Metabolic Panel:  Recent Labs Lab 11/12/15 2055 11/13/15 0850 11/14/15 0455  NA 139 136 139  K 4.1  4.4 3.8  CL 105 105 107  CO2 26 24 26   GLUCOSE 86 137* 108*  BUN 17 15 14   CREATININE 1.13 1.13 1.22  CALCIUM 9.3 8.8* 8.6*   Liver Function Tests:  Recent Labs Lab 11/14/15 0455  AST 16  ALT 20  ALKPHOS 36*  BILITOT <0.1*  PROT 6.0*  ALBUMIN 3.1*   No results for input(s): LIPASE, AMYLASE in the last 168 hours. No results for input(s): AMMONIA in the last 168 hours. Cardiac Enzymes: No results for input(s): CKTOTAL, CKMB, CKMBINDEX, TROPONINI in the last 168 hours. BNP (last 3 results) No results for input(s): BNP in the last 8760 hours. CBG: No results for input(s): GLUCAP in the last 168 hours. Time spent: 30 minutes  Signed:  Lynden OxfordEL, Nary Sneed  [facility] 11/14/2015 , 6:40 AM

## 2015-11-18 LAB — CULTURE, BLOOD (ROUTINE X 2)
CULTURE: NO GROWTH
Culture: NO GROWTH

## 2016-02-25 ENCOUNTER — Emergency Department (HOSPITAL_COMMUNITY)
Admission: EM | Admit: 2016-02-25 | Discharge: 2016-02-25 | Disposition: A | Discharge status: Home / Self Care | Payer: BLUE CROSS/BLUE SHIELD | Attending: Emergency Medicine | Admitting: Emergency Medicine

## 2016-02-25 ENCOUNTER — Encounter (HOSPITAL_COMMUNITY): Payer: Self-pay | Admitting: Emergency Medicine

## 2016-02-25 DIAGNOSIS — G8929 Other chronic pain: Secondary | ICD-10-CM | POA: Insufficient documentation

## 2016-02-25 DIAGNOSIS — Z79899 Other long term (current) drug therapy: Secondary | ICD-10-CM | POA: Diagnosis not present

## 2016-02-25 DIAGNOSIS — M25561 Pain in right knee: Secondary | ICD-10-CM | POA: Diagnosis present

## 2016-02-25 DIAGNOSIS — F17210 Nicotine dependence, cigarettes, uncomplicated: Secondary | ICD-10-CM | POA: Insufficient documentation

## 2016-02-25 MED ORDER — IBUPROFEN 800 MG PO TABS: 800.0000 mg | ORAL_TABLET | Freq: Three times a day (TID) | ORAL | 0 refills | Status: AC

## 2016-02-25 MED ORDER — CYCLOBENZAPRINE HCL 10 MG PO TABS: 10.0000 mg | ORAL_TABLET | Freq: Two times a day (BID) | ORAL | 0 refills | Status: AC | PRN

## 2016-02-25 NOTE — ED Provider Notes (Signed)
[facility]-EMERGENCY DEPT Provider Note   CSN: [id number] Arrival date & time: 02/25/16  1222  By signing my name below, I, Teofilo Pod, attest that this documentation has been prepared under the direction and in the presence of Fayrene Helper, PA-C. Electronically Signed: Teofilo Pod, ED Scribe. 02/25/2016. 2:28 PM.   History   Chief Complaint Chief Complaint  Patient presents with  . Knee Pain    The history is provided by the patient. No language interpreter was used.   HPI Comments:  Gary Newton is a 31 y.o. male who presents to the Emergency Department complaining of constant right knee pain x 1 month. Pt states that the pain is worsening, and notes no new injury/trauma. Pt states that several years ago he had a partial ligament tear in his right knee playing football, and was treated with medication and physical therapy. Pt denies any pain to his hip or ankle. No alleviating factors noted. Pt denies other associated symptoms. No recent injury.  No numbness or weakness.   Past Medical History:  Diagnosis Date  . GERD (gastroesophageal reflux disease)     Patient Active Problem List   Diagnosis Date Noted  . Cough 11/13/2015  . CAP (community acquired pneumonia) 11/13/2015  . Meningitis 11/12/2015    Past Surgical History:  Procedure Laterality Date  . I&D EXTREMITY Left 07/06/2014   Procedure: IRRIGATION AND DEBRIDEMENT MIDDLE FINGER;  Surgeon: Bradly Bienenstock, MD;  Location: [facility] OR;  Service: Orthopedics;  Laterality: Left;       Home Medications    Prior to Admission medications   Medication Sig Start Date End Date Taking? Authorizing Provider  butalbital-acetaminophen-caffeine (FIORICET, ESGIC) 50-325-40 MG tablet Take 1 tablet by mouth every 4 (four) hours as needed for headache. 11/14/15   Rolly Salter, MD  HYDROcodone-acetaminophen (NORCO/VICODIN) 5-325 MG tablet Take 1 tablet by mouth every 8 (eight) hours as needed. Patient not taking: Reported on  11/12/2015 09/22/15   Lona Kettle, PA-C  ondansetron (ZOFRAN) 4 MG tablet Take 1 tablet (4 mg total) by mouth every 6 (six) hours. Patient not taking: Reported on 11/12/2015 04/21/15   Santiago Glad, PA-C  oxyCODONE-acetaminophen (PERCOCET) 5-325 MG per tablet Take 1 tablet by mouth every 4 (four) hours as needed for moderate pain. Patient not taking: Reported on 11/12/2015 07/06/14   Bradly Bienenstock, MD  sodium chloride (OCEAN) 0.65 % SOLN nasal spray Place 1 spray into both nostrils as needed for congestion. 11/14/15   Rolly Salter, MD    Family History Family History  Problem Relation Age of Onset  . Bell's palsy Mother   . Prostate cancer Father     Social History Social History  Substance Use Topics  . Smoking status: Current Every Day Smoker    Packs/day: 0.50    Years: 10.00    Types: Cigarettes  . Smokeless tobacco: Never Used  . Alcohol use Yes     Comment: occ     Allergies   Vancomycin   Review of Systems Review of Systems  Musculoskeletal: Positive for arthralgias.  Neurological: Negative for numbness.     Physical Exam Updated Vital Signs BP 130/82 (BP Location: Right Arm)   Pulse 70   Temp 97.8 F (36.6 C) (Oral)   Resp 16   SpO2 100%   Physical Exam  Constitutional: He appears well-developed and well-nourished. No distress.  HENT:  Head: Normocephalic and atraumatic.  Eyes: Conjunctivae are normal.  Cardiovascular: Normal rate.   Pulmonary/Chest:  Effort normal.  Abdominal: He exhibits no distension.  Musculoskeletal:  Right knee: Patella located, no significant edema, erythema, gross deformity. Nontender to touch. No evidence of Baker's cyst. Negative anterior or posterior drawer test. No pain with varus and valgus maneuver.   Neurological: He is alert.  Skin: Skin is warm and dry.  Psychiatric: He has a normal mood and affect.  Nursing note and vitals reviewed.    ED Treatments / Results  DIAGNOSTIC STUDIES:  Oxygen Saturation is  100% on RA, normal by my interpretation.    COORDINATION OF CARE:  2:28 PM Discussed treatment plan with pt at bedside and pt agreed to plan.   Labs (all labs ordered are listed, but only abnormal results are displayed) Labs Reviewed - No data to display  EKG  EKG Interpretation None       Radiology No results found.  Procedures Procedures (including critical care time)  Medications Ordered in ED Medications - No data to display   Initial Impression / Assessment and Plan / ED Course  I have reviewed the triage vital signs and the nursing notes.  Pertinent labs & imaging results that were available during my care of the patient were reviewed by me and considered in my medical decision making (see chart for details).     BP 130/82 (BP Location: Right Arm)   Pulse 70   Temp 97.8 F (36.6 C) (Oral)   Resp 16   SpO2 100%    Final Clinical Impressions(s) / ED Diagnoses   Final diagnoses:  Chronic pain of right knee    New Prescriptions New Prescriptions   CYCLOBENZAPRINE (FLEXERIL) 10 MG TABLET    Take 1 tablet (10 mg total) by mouth 2 (two) times daily as needed for muscle spasms.   IBUPROFEN (ADVIL,MOTRIN) 800 MG TABLET    Take 1 tablet (800 mg total) by mouth 3 (three) times daily.   I personally performed the services described in this documentation, which was scribed in my presence. The recorded information has been reviewed and is accurate.   2:34 PM Pt with recurrent R knee pain.  Diagnosed with partial ligamental tear to same knee several years prior.  On exam, no reproducible pain. He is able to ambulate, no evidence to suggest septic joint.  I suspect internal derangement of the R knee.  Knee sleeve provided for support.  RICE therapy discussed. Recommend f/u with ortho for further care.     Fayrene HelperBowie Alilah Mcmeans, PA-C 02/25/16 1436    Arby BarretteMarcy Pfeiffer, MD 02/27/16 [phone]

## 2016-02-25 NOTE — ED Triage Notes (Signed)
Rt knee pain x a couple of months now worse the last couple of days no new I njury hurts to walk,

## 2016-02-25 NOTE — Discharge Instructions (Signed)
Please follow up closely with an orthopedic specialist for further management of your recurrent right knee pain, which may indicate internal injury to your knee joint.  Wear knee sleeve for support.

## 2016-06-02 ENCOUNTER — Encounter (HOSPITAL_COMMUNITY): Payer: Self-pay | Admitting: Family Medicine

## 2016-06-02 ENCOUNTER — Telehealth (HOSPITAL_COMMUNITY): Payer: Self-pay | Admitting: *Deleted

## 2016-06-02 ENCOUNTER — Ambulatory Visit (HOSPITAL_COMMUNITY)
Admission: EM | Admit: 2016-06-02 | Discharge: 2016-06-02 | Disposition: A | Discharge status: Home / Self Care | Payer: Self-pay | Attending: Internal Medicine | Admitting: Internal Medicine

## 2016-06-02 DIAGNOSIS — J301 Allergic rhinitis due to pollen: Secondary | ICD-10-CM

## 2016-06-02 MED ORDER — PREDNISONE 50 MG PO TABS: ORAL_TABLET | ORAL | 0 refills | Status: AC

## 2016-06-02 MED ORDER — PREDNISONE 50 MG PO TABS: ORAL_TABLET | ORAL | 0 refills | Status: DC

## 2016-06-02 NOTE — ED Triage Notes (Signed)
Pt here with URI symptoms.  

## 2016-06-02 NOTE — Discharge Instructions (Signed)
Sudafed PE 10 mg every 4 to 6 hours as needed for congestion Allegra or Zyrtec daily as needed for drainage and runny nose. For stronger antihistamine may take Chlor-Trimeton 2 to 4 mg every 4 to 6 hours, may cause drowsiness. Saline nasal spray used frequently. Ibuprofen 600 mg every 6 hours as needed for pain, discomfort or fever. Drink plenty of fluids and stay well-hydrated. Flonase or Rhinocort nasal spray dailypred

## 2016-06-02 NOTE — ED Provider Notes (Signed)
CSN: [id number]     Arrival date & time 06/02/16  1457 History   First MD Initiated Contact with Patient 06/02/16 1618     Chief Complaint  Patient presents with  . Cough  . Nasal Congestion   (Consider location/radiation/quality/duration/timing/severity/associated sxs/prior Treatment) 31 year old male is complaining of a frontal headache for 3 days. Also having chest congestion, nasal congestion, runny nose, scratchy throat and PND. Denies fever or chills. 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 He has taken some OTC medications without significant relief.      Past Medical History:  Diagnosis Date  . GERD (gastroesophageal reflux disease)    Past Surgical History:  Procedure Laterality Date  . I&D EXTREMITY Left 07/06/2014   Procedure: IRRIGATION AND DEBRIDEMENT MIDDLE FINGER;  Surgeon: Bradly Bienenstock, MD;  Location: [facility] OR;  Service: Orthopedics;  Laterality: Left;   Family History  Problem Relation Age of Onset  . Bell's palsy Mother   . Prostate cancer Father    Social History  Substance Use Topics  . Smoking status: Current Every Day Smoker    Packs/day: 0.50    Years: 10.00    Types: Cigarettes  . Smokeless tobacco: Never Used  . Alcohol use Yes     Comment: occ    Review of Systems  Constitutional: Negative.  Negative for diaphoresis, fatigue and fever.  HENT: Positive for congestion, postnasal drip and rhinorrhea. Negative for ear pain, facial swelling, sore throat and trouble swallowing.        Scratchy throat  Eyes: Negative for pain, discharge and redness.  Respiratory: Positive for cough. Negative for chest tightness and shortness of breath.   Cardiovascular: Negative.   Gastrointestinal: Negative.   Musculoskeletal: Negative.  Negative for neck pain and neck stiffness.  Neurological: Positive for headaches.       Headaches are intermittent, comes and goes.  All other systems reviewed and are negative.   Allergies   Vancomycin  Home Medications   Prior to Admission medications   Medication Sig Start Date End Date Taking? Authorizing Provider  butalbital-acetaminophen-caffeine (FIORICET, ESGIC) 50-325-40 MG tablet Take 1 tablet by mouth every 4 (four) hours as needed for headache. 11/14/15   Rolly Salter, MD  cyclobenzaprine (FLEXERIL) 10 MG tablet Take 1 tablet (10 mg total) by mouth 2 (two) times daily as needed for muscle spasms. 02/25/16   Fayrene Helper, PA-C  ibuprofen (ADVIL,MOTRIN) 800 MG tablet Take 1 tablet (800 mg total) by mouth 3 (three) times daily. 02/25/16   Fayrene Helper, PA-C  predniSONE (DELTASONE) 50 MG tablet 1 tab po daily for 6 days. Take with food. 06/02/16   Hayden Rasmussen, NP  sodium chloride (OCEAN) 0.65 % SOLN nasal spray Place 1 spray into both nostrils as needed for congestion. 11/14/15   Rolly Salter, MD   Meds Ordered and Administered this Visit  Medications - No data to display  BP (!) 141/97   Pulse 81   Temp 98.3 F (36.8 C)   Resp 18   SpO2 96%  No data found.   Physical Exam  Constitutional: He is oriented to person, place, and time. He appears well-developed and well-nourished. No distress.  HENT:  Bilateral TMs are normal. Oropharynx with minor erythema, cobblestoning and scant clear PND.  Eyes: EOM are normal.  Neck: Normal range of motion. Neck supple.  Cardiovascular: Normal rate, regular rhythm and normal heart sounds.   Pulmonary/Chest: Effort normal and breath sounds normal. No respiratory distress. He  has no wheezes. He has no rales.  No wheezing or coarseness with forced expirations or cough.  Musculoskeletal: Normal range of motion. He exhibits no edema.  Lymphadenopathy:    He has no cervical adenopathy.  Neurological: He is alert and oriented to person, place, and time.  Skin: Skin is warm and dry. No rash noted.  Psychiatric: He has a normal mood and affect.  Nursing note and vitals reviewed.   Urgent Care Course     Procedures  (including critical care time)  Labs Review Labs Reviewed - No data to display  Imaging Review No results found.   Visual Acuity Review  Right Eye Distance:   Left Eye Distance:   Bilateral Distance:    Right Eye Near:   Left Eye Near:    Bilateral Near:         MDM   1. Seasonal allergic rhinitis due to pollen    Sudafed PE 10 mg every 4 to 6 hours as needed for congestion Allegra or Zyrtec daily as needed for drainage and runny nose. For stronger antihistamine may take Chlor-Trimeton 2 to 4 mg every 4 to 6 hours, may cause drowsiness. Saline nasal spray used frequently. Ibuprofen 600 mg every 6 hours as needed for pain, discomfort or fever. Drink plenty of fluids and stay well-hydrated. Flonase or Rhinocort nasal spray daily Meds ordered this encounter  Medications  . predniSONE (DELTASONE) 50 MG tablet    Sig: 1 tab po daily for 6 days. Take with food.    Dispense:  6 tablet    Refill:  0    Order Specific Question:   Supervising Provider    Answer:   Eustace MooreMURRAY, LAURA W [[id number]]      Hayden RasmussenMabe, Branndon Tuite, NP 06/02/16 [phone]

## 2017-04-01 IMAGING — CR DG CHEST 2V
2 series · 2 of 2 positions shown · non-contrast
Comparison: None.

CLINICAL DATA: Chest congestion and productive cough.

EXAM:
CHEST  2 VIEW

[chest pa]
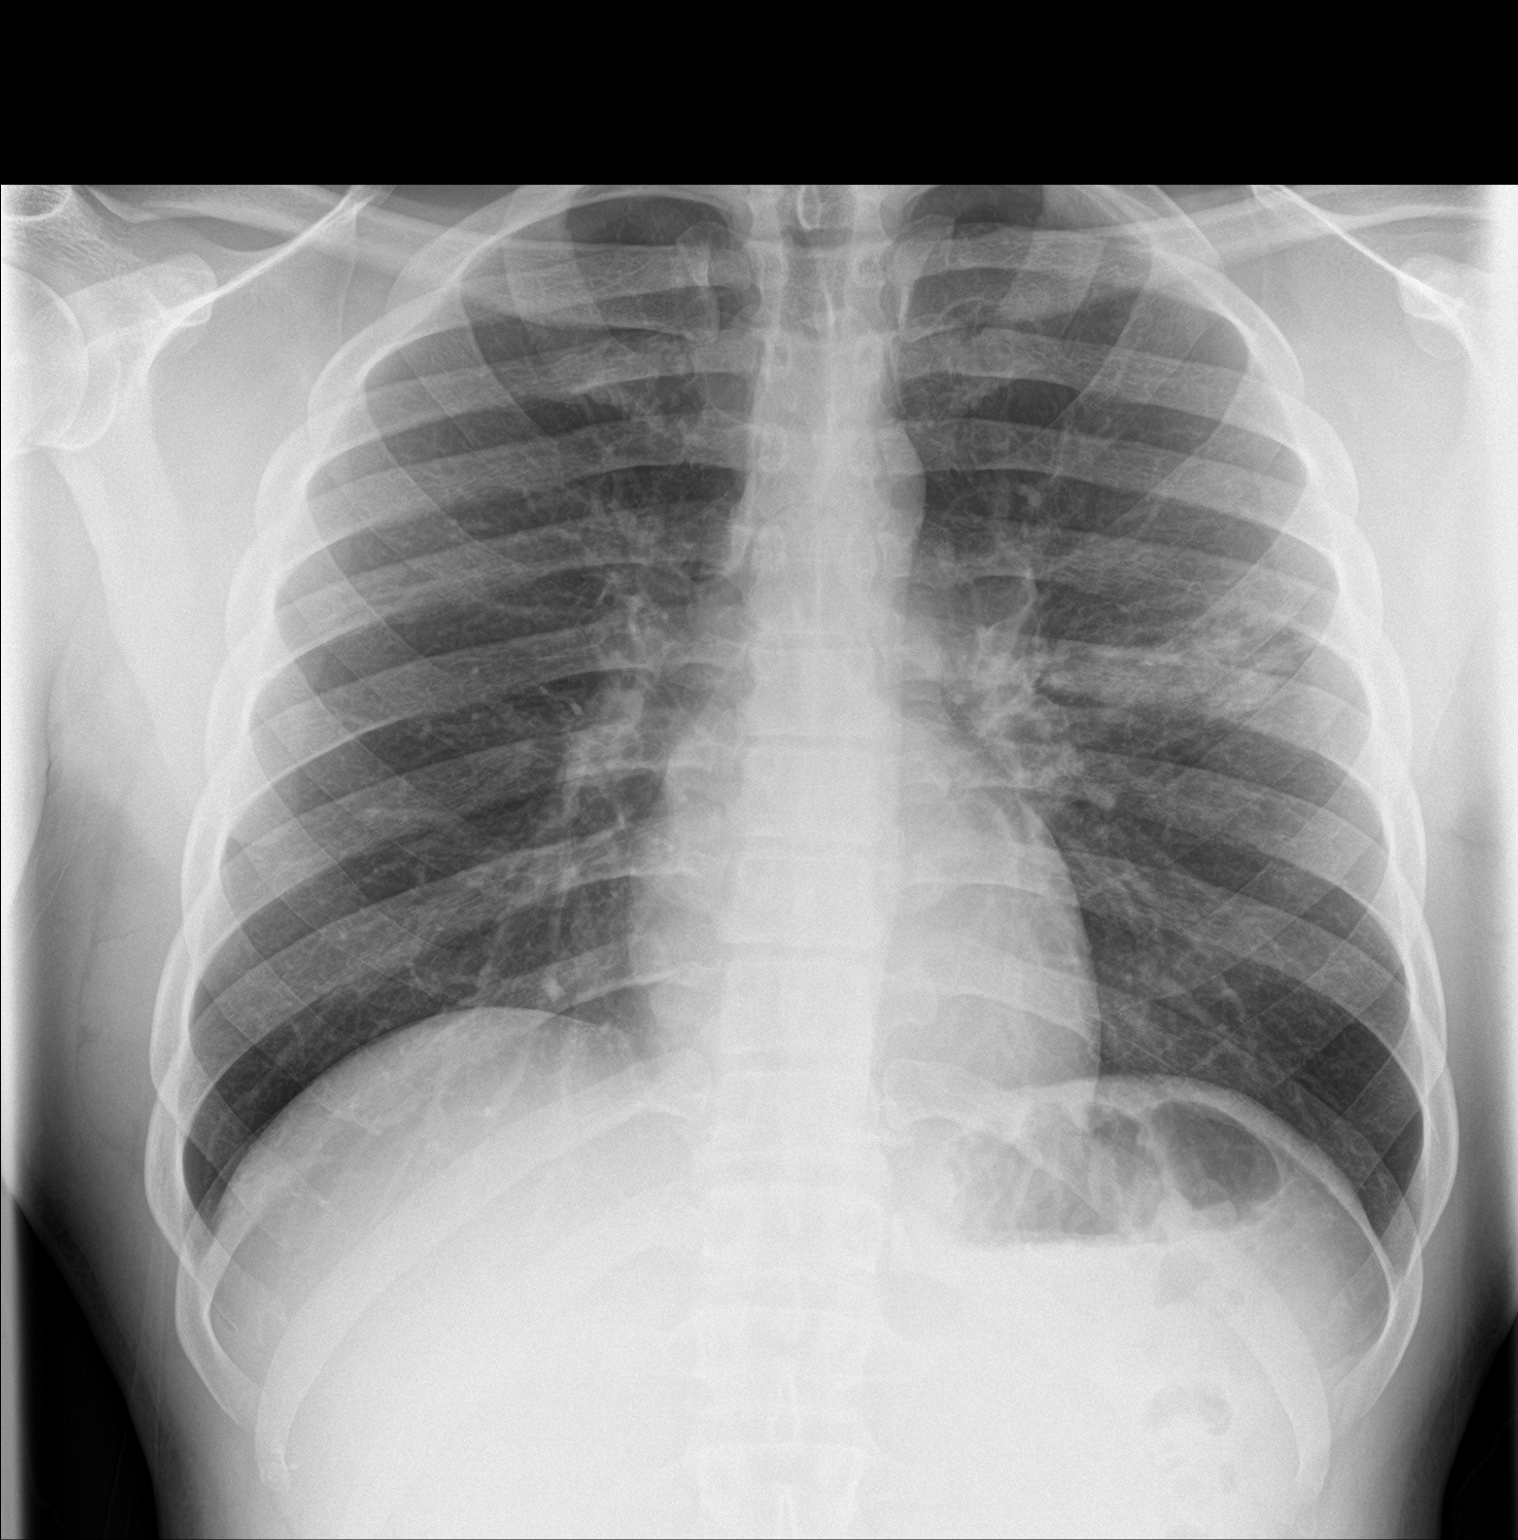

[chest lat]
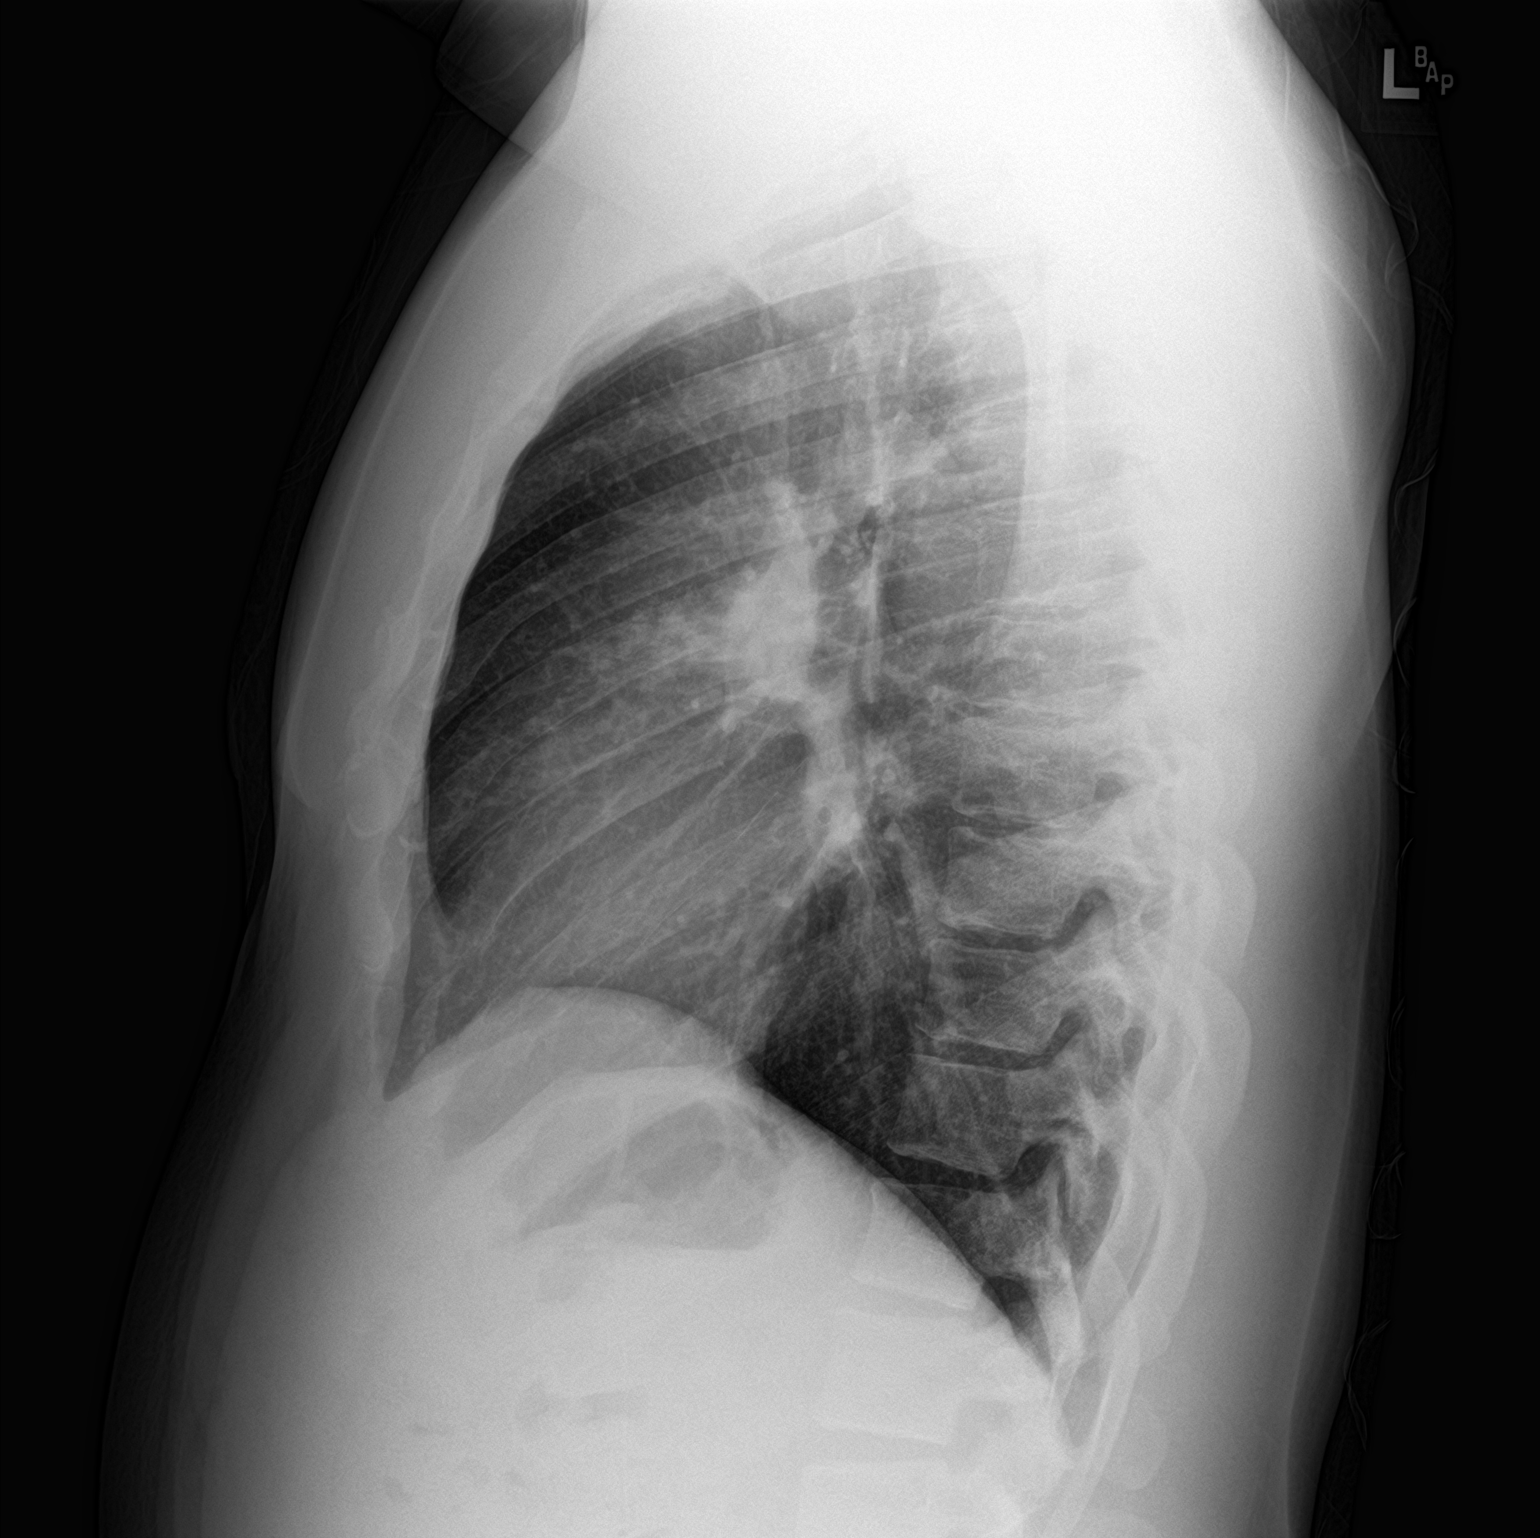

[2 of 2 positions shown; findings below may reference images not displayed]

FINDINGS: Heart size is normal. The left upper lobe pneumonia is present.
Right lung is clear. The visualized soft tissues and bony thorax are
unremarkable.
IMPRESSION: Left upper lobe pneumonia.

## 2018-06-28 ENCOUNTER — Other Ambulatory Visit: Payer: Self-pay | Admitting: Pediatric Intensive Care

## 2018-06-28 ENCOUNTER — Ambulatory Visit (HOSPITAL_COMMUNITY)
Admission: EM | Admit: 2018-06-28 | Discharge: 2018-06-28 | Disposition: A | Discharge status: Home / Self Care | Payer: BLUE CROSS/BLUE SHIELD | Attending: Family Medicine | Admitting: Family Medicine

## 2018-06-28 DIAGNOSIS — Z20822 Contact with and (suspected) exposure to covid-19: Secondary | ICD-10-CM

## 2018-06-28 NOTE — ED Notes (Signed)
Gary Newton, patient access reports this patient decided not to stay.

## 2018-07-02 LAB — NOVEL CORONAVIRUS, NAA: SARS-CoV-2, NAA: DETECTED — AB

## 2018-07-03 ENCOUNTER — Telehealth: Payer: Self-pay | Admitting: Critical Care Medicine

## 2018-07-03 NOTE — Telephone Encounter (Signed)
I spoke to the patient and he knows he had a positive test result  He has very mild symptoms and is in self isolation at this time  He knows to stay in isolation till all of his symptoms have resolved  He states he likely picked this virus up from a trip to [place] recently

## 2018-07-03 NOTE — Telephone Encounter (Signed)
-----  Message from Carlisle Beers, RN sent at 07/02/2018  5:08 PM EDT ----- Pt informed positive for Covid 19. Pt instructed to stay in self isolation until non test criteria have been met. Discussed mild, moderate and severe signs of Covid 19. Pt advised to call 911 for worsening respiratory issues/distress or signs of dehydration. Discussed home isolation and quarantine.
